# Patient Record
Sex: Male | Born: 1950
Health system: Southern US, Community
[De-identification: ages and names within clinical notes are randomized; demographics above are authoritative.]

## PROBLEM LIST (undated history)

## (undated) DIAGNOSIS — E782 Mixed hyperlipidemia: Secondary | ICD-10-CM

## (undated) DIAGNOSIS — I739 Peripheral vascular disease, unspecified: Secondary | ICD-10-CM

## (undated) DIAGNOSIS — I471 Supraventricular tachycardia: Secondary | ICD-10-CM

## (undated) DIAGNOSIS — G4733 Obstructive sleep apnea (adult) (pediatric): Secondary | ICD-10-CM

## (undated) DIAGNOSIS — Z87442 Personal history of urinary calculi: Secondary | ICD-10-CM

## (undated) DIAGNOSIS — F329 Major depressive disorder, single episode, unspecified: Secondary | ICD-10-CM

## (undated) DIAGNOSIS — F32A Depression, unspecified: Secondary | ICD-10-CM

## (undated) DIAGNOSIS — J449 Chronic obstructive pulmonary disease, unspecified: Secondary | ICD-10-CM

## (undated) DIAGNOSIS — F419 Anxiety disorder, unspecified: Secondary | ICD-10-CM

## (undated) DIAGNOSIS — K219 Gastro-esophageal reflux disease without esophagitis: Secondary | ICD-10-CM

## (undated) DIAGNOSIS — F410 Panic disorder [episodic paroxysmal anxiety] without agoraphobia: Secondary | ICD-10-CM

## (undated) DIAGNOSIS — I4719 Other supraventricular tachycardia: Secondary | ICD-10-CM

## (undated) HISTORY — PX: BACK SURGERY: SHX140

## (undated) HISTORY — PX: OTHER SURGICAL HISTORY: SHX169

## (undated) HISTORY — DX: Anxiety disorder, unspecified: F41.9

## (undated) HISTORY — DX: Obstructive sleep apnea (adult) (pediatric): G47.33

## (undated) HISTORY — DX: Major depressive disorder, single episode, unspecified: F32.9

## (undated) HISTORY — DX: Gastro-esophageal reflux disease without esophagitis: K21.9

## (undated) HISTORY — DX: Chronic obstructive pulmonary disease, unspecified: J44.9

## (undated) HISTORY — DX: Peripheral vascular disease, unspecified: I73.9

## (undated) HISTORY — PX: CORONARY ANGIOPLASTY WITH STENT PLACEMENT: SHX49

## (undated) HISTORY — PX: CARDIAC ELECTROPHYSIOLOGY MAPPING AND ABLATION: SHX1292

## (undated) HISTORY — DX: Depression, unspecified: F32.A

## (undated) HISTORY — DX: Supraventricular tachycardia: I47.1

## (undated) HISTORY — DX: Personal history of urinary calculi: Z87.442

## (undated) HISTORY — DX: Other supraventricular tachycardia: I47.19

## (undated) HISTORY — DX: Mixed hyperlipidemia: E78.2

---

## 2004-12-23 ENCOUNTER — Ambulatory Visit: Payer: Self-pay | Admitting: Cardiology

## 2005-09-17 ENCOUNTER — Ambulatory Visit: Payer: Self-pay | Admitting: Cardiology

## 2005-10-04 ENCOUNTER — Ambulatory Visit: Payer: Self-pay | Admitting: Cardiology

## 2008-07-09 ENCOUNTER — Ambulatory Visit: Payer: Self-pay | Admitting: Cardiology

## 2008-07-19 ENCOUNTER — Ambulatory Visit: Payer: Self-pay | Admitting: Cardiology

## 2008-08-09 ENCOUNTER — Ambulatory Visit: Payer: Self-pay | Admitting: Cardiology

## 2009-01-20 ENCOUNTER — Ambulatory Visit: Payer: Self-pay | Admitting: Cardiology

## 2009-02-14 ENCOUNTER — Encounter: Payer: Self-pay | Admitting: Cardiology

## 2009-02-14 ENCOUNTER — Ambulatory Visit: Payer: Self-pay | Admitting: Internal Medicine

## 2009-02-20 ENCOUNTER — Encounter: Payer: Self-pay | Admitting: Cardiology

## 2009-02-24 ENCOUNTER — Ambulatory Visit: Payer: Self-pay | Admitting: Cardiology

## 2009-02-24 ENCOUNTER — Ambulatory Visit (HOSPITAL_COMMUNITY): Admission: RE | Admit: 2009-02-24 | Discharge: 2009-02-24 | Payer: Self-pay | Admitting: Cardiology

## 2009-02-26 ENCOUNTER — Encounter: Payer: Self-pay | Admitting: Cardiology

## 2009-03-03 ENCOUNTER — Ambulatory Visit: Payer: Self-pay | Admitting: Internal Medicine

## 2009-03-03 ENCOUNTER — Inpatient Hospital Stay (HOSPITAL_COMMUNITY): Admission: RE | Admit: 2009-03-03 | Discharge: 2009-03-04 | Payer: Self-pay | Admitting: Internal Medicine

## 2009-03-07 ENCOUNTER — Encounter: Payer: Self-pay | Admitting: Cardiovascular Disease

## 2009-03-24 ENCOUNTER — Ambulatory Visit: Payer: Self-pay | Admitting: Cardiology

## 2009-03-27 ENCOUNTER — Encounter: Payer: Self-pay | Admitting: Cardiology

## 2009-04-03 ENCOUNTER — Ambulatory Visit: Payer: Self-pay | Admitting: Internal Medicine

## 2009-04-03 DIAGNOSIS — I739 Peripheral vascular disease, unspecified: Secondary | ICD-10-CM

## 2009-04-03 DIAGNOSIS — I959 Hypotension, unspecified: Secondary | ICD-10-CM | POA: Insufficient documentation

## 2009-04-03 DIAGNOSIS — I471 Supraventricular tachycardia: Secondary | ICD-10-CM

## 2009-04-10 ENCOUNTER — Encounter (INDEPENDENT_AMBULATORY_CARE_PROVIDER_SITE_OTHER): Payer: Self-pay | Admitting: *Deleted

## 2009-04-10 ENCOUNTER — Ambulatory Visit: Payer: Self-pay | Admitting: Cardiovascular Disease

## 2009-04-10 DIAGNOSIS — F17200 Nicotine dependence, unspecified, uncomplicated: Secondary | ICD-10-CM | POA: Insufficient documentation

## 2009-04-22 ENCOUNTER — Encounter: Payer: Self-pay | Admitting: Cardiovascular Disease

## 2009-04-24 ENCOUNTER — Encounter: Payer: Self-pay | Admitting: Cardiovascular Disease

## 2009-04-30 ENCOUNTER — Inpatient Hospital Stay (HOSPITAL_COMMUNITY): Admission: RE | Admit: 2009-04-30 | Discharge: 2009-05-01 | Payer: Self-pay | Admitting: Cardiovascular Disease

## 2009-04-30 ENCOUNTER — Ambulatory Visit: Payer: Self-pay | Admitting: Cardiovascular Disease

## 2009-05-27 ENCOUNTER — Ambulatory Visit: Payer: Self-pay | Admitting: Cardiovascular Disease

## 2009-10-06 ENCOUNTER — Encounter: Payer: Self-pay | Admitting: Cardiology

## 2009-10-06 ENCOUNTER — Ambulatory Visit: Payer: Self-pay | Admitting: Cardiology

## 2009-10-08 ENCOUNTER — Encounter: Payer: Self-pay | Admitting: Cardiology

## 2009-10-09 ENCOUNTER — Encounter: Payer: Self-pay | Admitting: Cardiology

## 2009-12-23 ENCOUNTER — Encounter: Payer: Self-pay | Admitting: Cardiology

## 2009-12-23 ENCOUNTER — Telehealth (INDEPENDENT_AMBULATORY_CARE_PROVIDER_SITE_OTHER): Payer: Self-pay | Admitting: *Deleted

## 2009-12-26 ENCOUNTER — Ambulatory Visit: Payer: Self-pay | Admitting: Cardiology

## 2009-12-26 DIAGNOSIS — R609 Edema, unspecified: Secondary | ICD-10-CM

## 2009-12-26 DIAGNOSIS — R0602 Shortness of breath: Secondary | ICD-10-CM | POA: Insufficient documentation

## 2009-12-26 DIAGNOSIS — F411 Generalized anxiety disorder: Secondary | ICD-10-CM | POA: Insufficient documentation

## 2009-12-29 ENCOUNTER — Ambulatory Visit: Payer: Self-pay | Admitting: Cardiology

## 2010-01-02 ENCOUNTER — Encounter: Payer: Self-pay | Admitting: Cardiology

## 2010-01-09 ENCOUNTER — Encounter (INDEPENDENT_AMBULATORY_CARE_PROVIDER_SITE_OTHER): Payer: Self-pay | Admitting: *Deleted

## 2010-01-15 ENCOUNTER — Ambulatory Visit: Payer: Self-pay | Admitting: Cardiology

## 2010-02-02 ENCOUNTER — Encounter (INDEPENDENT_AMBULATORY_CARE_PROVIDER_SITE_OTHER): Payer: Self-pay | Admitting: *Deleted

## 2010-02-02 ENCOUNTER — Encounter: Payer: Self-pay | Admitting: Cardiology

## 2010-02-17 ENCOUNTER — Encounter: Payer: Self-pay | Admitting: Cardiology

## 2010-02-20 ENCOUNTER — Encounter (INDEPENDENT_AMBULATORY_CARE_PROVIDER_SITE_OTHER): Payer: Self-pay | Admitting: *Deleted

## 2010-12-31 NOTE — Letter (Signed)
Summary: Engineer, materials at [facility]  [address]   [place], [place] [address]   Phone: [phone]  Fax: [phone]        February 20, 2010 MRN: [id number]   AVERY KLINGBEIL [address] [place], [place]  [address]   Dear Mr. FUSTON,  Your test ordered by Selena Batten has been reviewed by your physician (or physician assistant) and was found to be normal or stable. Your physician (or physician assistant) felt no changes were needed at this time.  ____ Echocardiogram  ____ Cardiac Stress Test  __X__ Lab Work  ____ Peripheral vascular study of arms, legs or neck  ____ CT scan or X-ray  ____ Lung or Breathing test  ____ Other:   Thank you.   Hoover Brunette, LPN    Duane Boston, M.D., F.A.C.C. Thressa Sheller, M.D., F.A.C.C. Oneal Grout, M.D., F.A.C.C. Cheree Ditto, M.D., F.A.C.C. Daiva Nakayama, M.D., F.A.C.C. Kenney Houseman, M.D., F.A.C.C. Jeanne Ivan, PA-C

## 2010-12-31 NOTE — Miscellaneous (Signed)
Summary: Orders Update - BMET   Clinical Lists Changes  Orders: Added new Test order of T-Basic Metabolic Panel ([id number]) - Signed 

## 2010-12-31 NOTE — Assessment & Plan Note (Signed)
Summary: vitals, f/u on edema -agh  Nurse Visit   Vital Signs:  Patient profile:   60 year old male Weight:      319.50 pounds Pulse rate:   90 / minute BP sitting:   115 / 74  (left arm) Cuff size:   large  Vitals Entered By: Hoover Brunette, LPN (December 29, 2009 9:12 AM)  Patient Instructions: 1)  Labs:  end of week  (BMET) 2)  Follow up:  Thursday, January 15, 2010 at 9:15  Comments feeling some better today.  Legs still with edema, but better than before.   Hoover Brunette, LPN  December 29, 2009 9:13 AM   Per Dr. Andee Lineman, another BMET end of the week.  OV in 10-14 days.     Allergies: 1)  ! * Contrast Dye  Orders Added: 1)  T-Basic Metabolic Panel [phone]

## 2010-12-31 NOTE — Letter (Signed)
Summary: Generic Engineer, agricultural at [facility] [address]   [place], [place] [address]   Phone: [phone]  Fax: [phone]        February 02, 2010 MRN: [id number]    Edward Andrews [address] [place], [place]  [address]    Dear [name],  According to our records, it is now time for your follow up lab work.  Please take the enclosed order to the [facility] at your earliest convenience.          Sincerely,  Hoover Brunette, LPN  This letter has been electronically signed by your physician.

## 2010-12-31 NOTE — Letter (Signed)
Summary: Engineer, materials at [facility]  [address]   [place], [place] [address]   Phone: [phone]  Fax: [phone]        January 09, 2010 MRN: [id number]   DIJUAN SLEETH [address] [place], [place]  [address]   Dear Mr. GAHAN,  Your test ordered by Selena Batten has been reviewed by your physician (or physician assistant) and was found to be normal or stable. Your physician (or physician assistant) felt no changes were needed at this time.  ____ Echocardiogram  ____ Cardiac Stress Test  __X__ Lab Work  ____ Peripheral vascular study of arms, legs or neck  ____ CT scan or X-ray  ____ Lung or Breathing test  ____ Other:   Thank you.   Hoover Brunette, LPN    Duane Boston, M.D., F.A.C.C. Thressa Sheller, M.D., F.A.C.C. Oneal Grout, M.D., F.A.C.C. Cheree Ditto, M.D., F.A.C.C. Daiva Nakayama, M.D., F.A.C.C. Kenney Houseman, M.D., F.A.C.C. Jeanne Ivan, PA-C

## 2010-12-31 NOTE — Assessment & Plan Note (Signed)
Summary: edema --agh   Visit Type:  Follow-up Referring Provider:  Earnestine Leys Primary Provider:  Dr. Linna Darner  CC:  Edema, SOB, and panic attacks.  History of Present Illness: the patient is a 60 year old male with a history of nonobstructive coronary artery disease, status-post SVT ablation, hyperlipidemia, GERD, COPD and sleep apnea with ongoing tobacco use.  The patient has significant peripheral vascular disease and underwent stenting of the right common iliac artery in June of 2010.  Post procedure the patient had itching it was not clear whether was from contrast her Plavix.  Plavix was discontinued.  He had complete resolution of right thigh and buttock pain.  Dr. Excell Seltzer discussed PTA/stenting of the proximal SFA but the patient did not want to proceed at this point. The patient had contacted me several days ago after he had noticed that he had marked swelling in the lower extremities.  This was associated with significant anxiety and even panic attacks.  The patient felt that his legs were very tight but had no definite complaints of claudication.  He did have increased shortness of breath.  He denied any palpitations or syncope.  The patient stayed on his current regimen his blood diureses very much.  Clinical Review Panels:  Vascular Studies Arterial Doppler Performed at [facility]: Right ABI 0.74. Left ABI 0.81 Appears to be high grade stenosis of right iliac system and at least moderate disease below the right popliteal. There is moderate left popliteal and infrapopliteal disease. (03/07/2009)  Cardiac Imaging Cardiac Cath Findings  HEMODYNAMIC DATA:  Central aortic pressure 135/71.      IMPRESSION:   1. Peripheral vascular disease.   2. Severe stenosis of the right common iliac arteries, status post       percutaneous transluminal angioplasty and stent placement.   3. Severe stenosis of the left superficial femoral artery in the       proximal portion.   4. Small abdominal  aortic aneurysm.      RECOMMENDATIONS:  The patient will be continued on aspirin and Plavix on   a daily basis.  We monitored overnight.  We will see him back in the   office in several weeks and then we will arrange a time for him to come   back and have the left superficial femoral artery worked up.  We will   follow him with yearly ultrasounds to follow his infrarenal aortic   aneurysm.               Verne Carrow, MD   Electronically Signed            CM/MEDQ  D:  04/30/2009  T:  04/30/2009  Job:  [id number]      cc:   Learta Codding, MD,FACC  (04/30/2009)    Preventive Screening-Counseling & Management  Alcohol-Tobacco     Smoking Status: current     Packs/Day: 1.0     Year Started: 1981  Current Medications (verified): 1)  Metoprolol Succinate 50 Mg Xr24h-Tab (Metoprolol Succinate) .... Take One Tablet By Mouth Daily 2)  Omeprazole 20 Mg Cpdr (Omeprazole) .... Take 1 Tablet By Mouth Once A Day 3)  Potassium Chloride Crys Cr 20 Meq Cr-Tabs (Potassium Chloride Crys Cr) .... Take 2 Tabs ( ) Daily 4)  Simvastatin 40 Mg Tabs (Simvastatin) .... Take One Time At Bedtime 5)  Metoclopramide Hcl 10 Mg Tabs (Metoclopramide Hcl) .... Take One Tab At Bedtime 6)  Oxazepam 10 Mg Tabs (Oxazepam) .... Take 1 1/2 Tabs (  15mg ) Every Morning and At Noon 7)  Advair Diskus 250-50 Mcg/dose Misc (Fluticasone-Salmeterol) .... One Puff  Two Times A Day 8)  Duoneb 0.5-2.5 (3) Mg/27ml Soln (Ipratropium-Albuterol) .... Three Times A Day 9)  Ventolin Hfa 108 (90 Base) Mcg/act Aers (Albuterol Sulfate) .... Two Puffs Four Times A Day 10)  Aspirin Ec 325 Mg Tbec (Aspirin) .... Take One Tablet By Mouth Daily 11)  Furosemide 80 Mg Tabs (Furosemide) .... Take 1 Tablet By Mouth Two Times A Day 12)  Clonazepam 0.5 Mg Tabs (Clonazepam) .... Take 1 Tablet By Mouth Two Times A Day 13)  Citalopram Hydrobromide 20 Mg Tabs (Citalopram Hydrobromide) .... Take 1 Tablet By Mouth Every Morning  Allergies: 1)   ! * Contrast Dye  Comments:  Nurse/Medical Assistant: The patient's medications were reviewed with the patient and were updated in the Medication List. Pt brought medication bottles to office visit.  Cyril Loosen, RN, BSN (December 26, 2009 11:03 AM)  Past History:  Past Medical History: Last updated: 04/10/2009 supraventricular tachycardia 08/09 Dyslipidemia Cardiolite EF was apparently 29% Nonobstructive coronary artery disease at left heart   Possible Claudication  Chronic obstructive pulmonary disease  Gastroesophageal reflux disease   History of back surgeries and cervical surgeries  History of nephrolithiasis  Ongoing tobacco habituation Anxiety and depression obstructive sleep apnea    PVD  Family History: Reviewed history from 04/10/2009 and no changes required.  Mother-Cancer, Lung Father-Stomach cancer Brother-brain tumor Younger brother stroke No CAD  Social History: Reviewed history from 04/10/2009 and no changes required. Tobacco Use - Yes. 1ppd for 38 years Occasional alcohol No illicit drugs Married, 2 children Disabled. Former Naval architect Packs/Day:  1.0  Review of Systems       The patient complains of shortness of breath, sleep apnea, and leg swelling.  The patient denies fatigue, malaise, fever, weight gain/loss, vision loss, decreased hearing, hoarseness, chest pain, palpitations, prolonged cough, wheezing, coughing up blood, abdominal pain, blood in stool, nausea, vomiting, diarrhea, heartburn, incontinence, blood in urine, muscle weakness, joint pain, rash, skin lesions, headache, fainting, dizziness, depression, anxiety, enlarged lymph nodes, easy bruising or bleeding, and environmental allergies.    Vital Signs:  Patient profile:   60 year old male Height:      72 inches Weight:      327.25 pounds BMI:     44.54 Pulse rate:   85 / minute BP sitting:   132 / 84  (left arm) Cuff size:   large  Vitals Entered By: Cyril Loosen, RN, BSN  (December 26, 2009 10:55 AM)  Nutrition Counseling: Patient's BMI is greater than 25 and therefore counseled on weight management options. CC: Edema, SOB, panic attacks Comments Pt c/o edema, SOB, and panic attacks. States SOB has improved now that he has "attacks" under control.   Physical Exam  Additional Exam:  General: Well-developed, well-nourished in no distress head: Normocephalic and atraumatic eyes PERRLA/EOMI intact, conjunctiva and lids normal nose: No deformity or lesions mouth normal dentition, normal posterior pharynx neck: Supple, no JVD.  No masses, thyromegaly or abnormal cervical nodes lungs: Normal breath sounds bilaterally without wheezing.  Normal percussion heart: regular rate and rhythm with normal S1 and S2, no S3 or S4.  PMI is normal.  No pathological murmurs abdomen: Normal bowel sounds, abdomen is soft and nontender without masses, organomegaly or hernias noted.  No hepatosplenomegaly musculoskeletal: Back normal, normal gait muscle strength and tone normal pulsus: Pulse is normal in all 4 extremities Extremities:severe 3+ pitting edema  of both lower extremities extending up into the buttock area neurologic: Alert and oriented x 3 skin: Intact without lesions or rashes cervical nodes: No significant adenopathy psychologic: Normal affect    EKG  Procedure date:  12/26/2009  Findings:      normal sinus rhythm normal EKG heart rate 78 beats/min  Impression & Recommendations:  Problem # 1:  EDEMA (ICD-782.3) the patient has severe lower extremity edema.  The etiology is not entirely clear but he does have right-sided heart failure and we will obtain an echocardiogram to assess in particular right ventricular function.  I also ordered lower extremity Dopplers to rule out DVT.  The patient will be referred to the short stay unit for intravenous Lasix and oral potassium supplements the patient will done also be started on Lasix 80 mg twice a day 40 mg  equivalents of potassium daily we will recheck his blood work in particular potassium level and renal function on Monday.  The wall 7 nurse visit on Monday. Orders: Misc. Referral (Misc. Ref) T-Chest x-ray, 2 views ([id number]) 2-D Echocardiogram (2D Echo) Venous Duplex Lower Extremity (Venous Dup Lower E) T-Basic Metabolic Panel ([id number]) T-CBC No Diff ([id number]) T-BNP  (B Natriuretic Peptide) ([id number]) T-TSH ([id number] Orders: T-Basic Metabolic Panel [id number]) ... 12/29/2009  Problem # 2:  PAROXYSMAL SUPRAVENTRICULAR TACHYCARDIA (ICD-427.0) Assessment: Improved  His updated medication list for this problem includes:    Metoprolol Succinate 50 Mg Xr24h-tab (Metoprolol succinate) .Marland Kitchen... Take one tablet by mouth daily    Aspirin Ec 325 Mg Tbec (Aspirin) .Marland Kitchen... Take one tablet by mouth daily  Orders: T-Basic Metabolic Panel [id number]) T-CBC No Diff ([id number]) T-BNP  (B Natriuretic Peptide) ([id number]) T-TSH ([id number] Orders: T-Basic Metabolic Panel [id number]) ... 12/29/2009  Problem # 3:  PERIPHERAL VASCULAR DISEASE (ICD-443.9) the patient does have stable peripheral vascular disease.  He currently does not report claudication.  Problem # 4:  ANXIETY STATE, UNSPECIFIED (ICD-300.00) the patient has significant anxiety even today in the office.  He is very concerned and worried about his medical problems.  I started the patient citalopram 20 mg p.o. daily as well as an escalation in the dose of oxazepam adding 15 mg in the morning and 15 mg at noon.  Patient Instructions: 1)  Chest x-ray , 2D Echo, lower extremity doppler, labs, & IV Lasix - all done today.   2)  Lasix 80mg  two times a day  3)  K-Dur daily  4)  Labs again on Monday, 1/31 at hospital (BMET) 5)  Nurse visit on 1/31, Monday at 9:15 6)  Citalopram 20mg  daily  7)  Oxazepam 15mg  tabs every morning and noon 8)  Next follow up visit may be based on nurse visit for  Monday Prescriptions: POTASSIUM CHLORIDE CRYS CR 20 MEQ CR-TABS (POTASSIUM CHLORIDE CRYS CR) take 2 tabs ( ) daily  #60 x 6   Entered by:   Hoover Brunette, LPN   Authorized by:   Lewayne Bunting, MD, [facility]   Signed by:   Hoover Brunette, LPN on 76/16/0737   Method used:   [facility] to        [facility]  [address] [address] [facility]       [address]       [place], [place]  [address]       Ph: [phone] or [phone]       Fax: [phone]   RxID:   [id number] FUROSEMIDE 80 MG TABS (FUROSEMIDE)  Take 1 tablet by mouth two times a day  #60 x 6   Entered by:   Hoover Brunette, LPN   Authorized by:   Lewayne Bunting, MD, [facility]   Signed by:   Hoover Brunette, LPN on 16/08/9603   Method used:   [facility]        [facility]  [address] [facility]       [address]       [place], [place]  [address]       Ph: [phone] or [phone]       Fax: [phone]   RxID:   [id number] OXAZEPAM 10 MG TABS (OXAZEPAM) take 1 1/2 tabs (15mg ) every morning and at noon  #90 x 1   Entered by:   Hoover Brunette, LPN   Authorized by:   Lewayne Bunting, MD, [facility]   Signed by:   Hoover Brunette, LPN on 40/34/7425   Method used:   Handwritten   RxID:   [id number] CITALOPRAM HYDROBROMIDE 20 MG TABS (CITALOPRAM HYDROBROMIDE) Take 1 tablet by mouth every morning  #30 x 1   Entered by:   Hoover Brunette, LPN   Authorized by:   Lewayne Bunting, MD, [facility]   Signed by:   Hoover Brunette, LPN on 84/16/6063   Method used:   [facility]        [facility]  [address] [facility]       [address]       [place], [place]  [address]       Ph: [phone] or [phone]       Fax: [phone]   RxID:   [id number]

## 2010-12-31 NOTE — Assessment & Plan Note (Signed)
Summary: f/u on edema  -agh   Visit Type:  Follow-up Referring Provider:  Earnestine Leys Primary Provider:  Dr. Linna Darner  CC:  follow-up visit.  History of Present Illness: the patient is a 60 year old male with a history of nonobstructive coronary artery disease, status post SVT ablation, hyperlipidemia, alert, COPD and sleep apnea with ongoing tobacco use.  The patient also significant peripheral vascular disease and is status post stenting of the right common iliac artery in June of 2010.  There was also some salt off stenting of the proximal SFA but ultimately this procedure was not performed.  The patient was seen recently because of increased lower extremity edema as a matter fact yet severe/massive lower extremity edema.  The patient already had a CT scan of the abdomen and pelvis done but there were no obstructive masses explaining his lower extremity edema.  It was felt that the patient diastolic heart failure and we placed him on high dose Lasix.  We also followed closely his SMA-7.  His lower extremity edema has all but resolved now and his creatinine has stayed stable around 1.2.   Preventive Screening-Counseling & Management  Alcohol-Tobacco     Smoking Status: current     Smoking Cessation Counseling: yes     Packs/Day: 1 PPD  Current Medications (verified): 1)  Metoprolol Succinate 50 Mg Xr24h-Tab (Metoprolol Succinate) .... Take One Tablet By Mouth Daily 2)  Omeprazole 20 Mg Cpdr (Omeprazole) .... Take 1 Tablet By Mouth Once A Day 3)  Potassium Chloride Crys Cr 20 Meq Cr-Tabs (Potassium Chloride Crys Cr) .... Take 2 Tabs ( ) Daily 4)  Simvastatin 40 Mg Tabs (Simvastatin) .... Take One Time At Bedtime 5)  Metoclopramide Hcl 10 Mg Tabs (Metoclopramide Hcl) .... Take One Tab At Bedtime 6)  Oxazepam 15 Mg Caps (Oxazepam) .... Take One By Mouth in Am,one At Lunch,& Two At  Bedtime 7)  Advair Diskus 250-50 Mcg/dose Misc (Fluticasone-Salmeterol) .... One Puff  Two Times A Day 8)   Duoneb 0.5-2.5 (3) Mg/60ml Soln (Ipratropium-Albuterol) .... Three Times A Day 9)  Ventolin Hfa 108 (90 Base) Mcg/act Aers (Albuterol Sulfate) .... Two Puffs Four Times A Day 10)  Aspirin Ec 325 Mg Tbec (Aspirin) .... Take One Tablet By Mouth Daily 11)  Furosemide 80 Mg Tabs (Furosemide) .... Take 1 Tablet By Mouth Two Times A Day 12)  Clonazepam 0.5 Mg Tabs (Clonazepam) .... Take 1 Tablet By Mouth Two Times A Day 13)  Citalopram Hydrobromide 20 Mg Tabs (Citalopram Hydrobromide) .... Take 1 Tablet By Mouth Every Morning  Allergies (verified): 1)  ! * Contrast Dye  Comments:  Nurse/Medical Assistant: The patient is currently on medications but does not know the name or dosage at this time. Instructed to contact our office with details. Will update medication list at that time.  Past History:  Past Medical History: Last updated: 04/10/2009 supraventricular tachycardia 08/09 Dyslipidemia Cardiolite EF was apparently 29% Nonobstructive coronary artery disease at left heart   Possible Claudication  Chronic obstructive pulmonary disease  Gastroesophageal reflux disease   History of back surgeries and cervical surgeries  History of nephrolithiasis  Ongoing tobacco habituation Anxiety and depression obstructive sleep apnea    PVD  Past Surgical History: Last updated: 05/27/2009 1. 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  2. Kidney stone removal 3.Back surgeries 4. Neck surgery 5. PTA/stent of right common iliac  artery  Family History: Last updated: 04/10/2009  Mother-Cancer, Lung Father-Stomach cancer Brother-brain tumor Younger brother stroke No CAD  Social History: Last updated: 04/10/2009 Tobacco Use - Yes. 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  The patient denies fatigue, malaise, fever, weight gain/loss, vision loss, decreased hearing, hoarseness, chest pain, palpitations, shortness of breath, prolonged cough, wheezing, sleep apnea, coughing up blood, abdominal pain, blood in stool, nausea, vomiting, diarrhea, heartburn, incontinence, blood in urine, muscle weakness, joint pain, rash, skin lesions, headache, fainting, dizziness, depression, anxiety, enlarged lymph nodes, easy bruising or bleeding, and environmental allergies.    Vital Signs:  Patient profile:   60 year old male Height:      72 inches Weight:      305 pounds Pulse rate:   67 / minute BP sitting:   116 / 78  (left arm) Cuff size:   large  Vitals Entered By: Carlye Grippe (January 15, 2010 9:29 AM) CC: follow-up visit   Physical Exam  Additional Exam:  General: Well-developed, well-nourished in no distress head: Normocephalic and atraumatic eyes PERRLA/EOMI intact, conjunctiva and lids normal nose: No deformity or lesions mouth normal dentition, normal posterior pharynx neck: Supple, no JVD.  No masses, thyromegaly or abnormal cervical nodes lungs: Normal breath sounds bilaterally without wheezing.  Normal percussion heart: regular rate and rhythm with normal S1 and S2, no S3 or S4.  PMI is normal.  No pathological murmurs abdomen: Normal bowel sounds, abdomen is soft and nontender without masses, organomegaly or hernias noted.  No hepatosplenomegaly musculoskeletal: Back normal, normal gait muscle strength and tone normal pulsus: Pulse is normal in all 4 extremities Extremities: 1+ peripheral pitting edema neurologic: Alert and oriented x 3 skin: Intact without lesions or rashes cervical nodes: No significant adenopathy psychologic: Normal affect    Impression &  Recommendations:  Problem # 1:  EDEMA (ICD-782.3) the patient's edema has markedly improved.  We will continue on Lasix.  We will check an SMA-7 and a magnesium level in one month.  Problem # 2:  SHORTNESS OF BREATH (ICD-786.05) the patient's shortness of breath has markedly improved.  He clearly has diastolic heart failure. His updated medication list for this problem includes:    Metoprolol Succinate 50 Mg Xr24h-tab (Metoprolol succinate) .Marland Kitchen... Take one tablet by mouth daily    Aspirin Ec 325 Mg Tbec (Aspirin) .Marland Kitchen... Take one tablet by mouth daily    Furosemide 80 Mg Tabs (Furosemide) .Marland Kitchen... Take 1 tablet by mouth two times a day  Problem # 3:  PERIPHERAL VASCULAR DISEASE (ICD-443.9) Assessment: Improved  Problem # 4:  ANXIETY STATE, UNSPECIFIED (ICD-300.00) the patient's anxiety also is markedly improved on his current medical regimen.  Patient Instructions: 1)  BMET in one month. 2)  Follow up in  6 months.

## 2010-12-31 NOTE — Progress Notes (Signed)
Summary: edema  Phone Note Call from Patient   Summary of Call: c/o edema from knees down to feet along with SOB.  Per Dr. Andee Lineman, give Lasix 20mg  daily till seen in office on Friday.  Appt. scheduled for Friday, January 28.  Has been previously on Torsemide, but did not feel that it helped so has not been taking lately.  Pt. also requested something for nerves.  Advised pt. that we could not give at this time, has not been seen in office in about 6 months or more.  Can address at office visit or contact PMD.   Initial call taken by: Hoover Brunette, LPN,  December 23, 2009 10:55 AM

## 2010-12-31 NOTE — Miscellaneous (Signed)
Summary: rx - lasix  Clinical Lists Changes  Medications: Added new medication of FUROSEMIDE 20 MG TABS (FUROSEMIDE) Take 1 tablet by mouth once a day - Signed Rx of FUROSEMIDE 20 MG TABS (FUROSEMIDE) Take 1 tablet by mouth once a day;  #30 x 1;  Signed;  Entered by: Hoover Brunette, LPN;  Authorized by: Lewayne Bunting, MD, [facility];  Method used: [facility]  [address] [facility] [address], [place], [place], [place]  [address], Ph: [phone] or [phone], Fax: [phone]    Prescriptions: FUROSEMIDE 20 MG TABS (FUROSEMIDE) Take 1 tablet by mouth once a day  #30 x 1   Entered by:   Hoover Brunette, LPN   Authorized by:   Lewayne Bunting, MD, [facility]   Signed by:   Hoover Brunette, LPN on 25/36/6440   Method used:   [facility]        [facility]  [address] [facility]       [address]       [place], [place]  [address]       Ph: [phone] or [phone]       Fax: [phone]   RxID:   [id number]

## 2011-01-01 NOTE — Letter (Signed)
Summary: [facility] D/C DR. MOHAMMAD ANWAR  [facility] D[facility]C DR. MOHAMMAD ANWAR   Imported By: Zachary George 12/25/2009 16:44:57  _____________________________________________________________________  External Attachment:    Type:   Image     Comment:   External Document

## 2011-02-17 ENCOUNTER — Other Ambulatory Visit: Payer: Self-pay | Admitting: Cardiology

## 2011-02-25 NOTE — Telephone Encounter (Signed)
[place] 

## 2011-03-08 LAB — BASIC METABOLIC PANEL
BUN: 19 mg/dL (ref 6–23)
Calcium: 9.1 mg/dL (ref 8.4–10.5)
Creatinine, Ser: 0.86 mg/dL (ref 0.4–1.5)
GFR calc non Af Amer: >60 mL/min (ref >60)
Glucose, Bld: 109 mg/dL — ABNORMAL HIGH (ref 70–99)

## 2011-03-08 LAB — CBC
HCT: 42 % (ref 39.0–52.0)
Platelets: 194 10*3/uL (ref 150–400)
RDW: 14.6 % (ref 11.5–15.5)

## 2011-03-10 LAB — CBC
Hemoglobin: 15.4 g/dL (ref 13.0–17.0)
RBC: 4.71 MIL/uL (ref 4.22–5.81)
RDW: 14.3 % (ref 11.5–15.5)
WBC: 10.1 10*3/uL (ref 4.0–10.5)

## 2011-03-10 LAB — PROTIME-INR: INR: 1 (ref 0.00–1.49)

## 2011-03-10 LAB — APTT: aPTT: 28 seconds (ref 24–37)

## 2011-03-11 LAB — POCT I-STAT 3, ART BLOOD GAS (G3+)
pH, Arterial: 7.379 (ref 7.350–7.450)
pO2, Arterial: 61 mmHg — ABNORMAL LOW (ref 80.0–100.0)

## 2011-03-11 LAB — POCT I-STAT 3, VENOUS BLOOD GAS (G3P V)
Acid-base deficit: 1 mmol/L (ref 0.0–2.0)
Bicarbonate: 25 mEq/L — ABNORMAL HIGH (ref 20.0–24.0)
pH, Ven: 7.329 — ABNORMAL HIGH (ref 7.250–7.300)
pO2, Ven: 34 mmHg (ref 30.0–45.0)

## 2011-04-13 NOTE — Letter (Signed)
February 14, 2009    Learta Codding, MD,FACC  [address]  [place], [place] [address]   RE:  Edward Andrews, Edward Andrews  MRN:  [id number]  /  DOB:  November 10, 1951   Dear Michelle Piper,   It was a pleasure seeing Dewitt Judice at your request for considerations  regarding his supraventricular tachycardia.   As you remember, you were asked to see him because of pauses on his  event recorder and as I would have concurred, felt that was attributable  to the medications that were started for his SVT.   His history of SVT goes back about 30 years.  He describes abrupt onset  and offset tachy palpitations somewhat diuretic positive, triggered  frequently by bending as well as by sleeping, but also at least  initially while exerting.  Some years ago, when it started he would get  lightheaded and presyncopal with it, more recently that has not been the  case and has very few symptoms with it.  These can last 30 minutes to 5  or 6 hours.   They are clearly aggravated by stress and caffeine.  He can terminate  them sometimes with Valsalva.   They are associated with indigestion and chest discomfort that persist  further duration.   He does not have known heart disease.  However, he has discordant data.  A Cardiolite year and half ago demonstrated an ejection fraction of 39%  with inferolateral ischemia that is a reversible defect.  An echo was  done that was normal.  Apparently, catheterization was recommended and  he declined.   He is limited on exertion by his legs giving way.  This is somewhat  between a cramping and weakness and a pain.  This precedes short  limitations from breathing.   His other cardiac risk factors are notable for cigarettes, hypertension,  hypercholesterolemia, and family history.   He has also developed peripheral edema of late which has been quite  painful particularly in his right foot in the last couple of days.  He  dates his edema back to the onset of diltiazem and has been treated  with  furosemide for it.   PAST MEDICAL HISTORY:  Notable for COPD and GE reflux disease.   PAST SURGICAL HISTORY:  Notable for back surgeries, nephrolithiasis,  kidney surgery, neck surgeries, and tonsillectomy.   SOCIAL HISTORY:  He is married.  He has 2 children and 5 grandchildren.  He does not use recreational drugs.  He does use cigarettes and alcohol.   CURRENT MEDICATIONS:  1. Metoprolol 50.  2. Potassium.  3. Clonazepam.  4. Lasix 40.  5. Lisinopril 20.  6. Metoclopramide 10 at bedtime.  7. Diltiazem 180.  8. Oxazepam 30.  9. Citalopram 20.  10.Omeprazole 40.  11.Advair.  12.Simvastatin.   He has no known drug allergies.   PHYSICAL EXAMINATION:  VITAL SIGNS:  His blood pressure is 121/81.  His  pulse was 85.  His weight was 298 pounds, which appears to be up 40  pounds in the last three and half years.  GENERAL:  He smelled like cigarettes.  He was in no acute distress.  HEENT:  No icterus or xanthoma.  NECK:  Veins were impossible to discern.  His carotids are brisk and  full bilaterally without bruits.  BACK:  Without kyphosis or scoliosis.  LUNGS:  Clear.  HEART:  Sounds were regular, but distant.  ABDOMEN:  Protuberant, but soft.  EXTREMITIES:  Femoral pulses  were not easily appreciated.  Distal  pulses, I think were trace bilaterally.  There is 1-2+ peripheral edema  bilaterally up to the knees and there was some erythema and warmth on  the right foot.  NEUROLOGIC:  Grossly normal.   Electrocardiograms for tachycardia were of numerous on December 13, 2008,  he demonstrated narrow QRS tachycardia with a cycle length of 380 msec.  There was an R prime in lead V1, but he has this at baseline, was  interesting is that there is a prominent Q-wave in lead III, which is  not evident on his sinus rhythm electrocardiogram dated the same day.  The cycle length of this point was 600 msec with intervals of  0.16/0.10/0.35.  There was an R prime is noted and there is  a RS pattern  in lead III.  Also, the event recorder is as you noted associated with a  pause of 3.1 seconds.   IMPRESSION:  1. Supraventricular tachycardia, possibly atrioventricular nodal      reentry, but probably atrioventricular reentry based on the date of      onset.  2. Bradycardia, likely related to the medications for supraventricular      tachycardia.  3. Abnormal cardiovascular studies including:      a.     Myoview demonstrating inferolateral ischemia.  Ejection       fraction of 39% and an echo that showed normal left ventricular       function.  4. Exercise limitations on his legs question claudication.  5. Cardiac risk factors notable for:      a.     The above.      b.     Hypertension.      c.     Cigarettes.      d.     Dyslipidemia.      e.     Family history.  6. Morbid obesity.  7. Likely obstructive sleep apnea.  8. Possible gout in his right foot.  9. Poor  perfusion and decreased femoral pulses   DISCUSSION:  Michelle Piper, Mr. Shimabukuro clearly has supraventricular tachycardia and  he would like to get it ablated.  He really appreciated your insights as  to therapeutic options he would like to proceed.  We have discussed  potential benefits as well as potential risks including, but not limited  to death, perforation, heart block requiring pacemaker implantation,  vascular injury, and bleeding.  He understands these risks.  Given his  size and his use of benzodiazepines and the history consistent with  obstructive sleep apnea, I think doing it with general anesthesia is  most appropriate.   However, prior to this clarifying his coronary situation, I think it is  important.  I have discussed this with him and he is agreeable to  proceeding with catheterization.  We will so set him up for a  catheterization in the outpatient laboratory with Dr. Charlies Constable who  did his wife's catheterization prior to her bypass surgery.   I have suggested that he go to the emergency  room for further therapies  related to his foot, if the symptoms worsen.  I have also suggested that  a sleep study would be appropriate  to address possible sleep apnea.  Clearly his morbid obesity with a 35-pound weight gain is a potential  big issue.  This will require more long-term management.   Thank you very much for the consultation.    Sincerely,  Duke Salvia, MD, [facility]  Electronically Signed    SCK/MedQ  DD: 02/14/2009  DT: 02/15/2009  Job #: [id number]

## 2011-04-13 NOTE — Op Note (Signed)
NAME:  Edward Andrews, Edward Andrews NO.:  [id number]   MEDICAL RECORD NO.:  [id number]          PATIENT TYPE:  INP   LOCATION:  2005                         FACILITY:  [facility]   PHYSICIAN:  Duke Salvia, MD, FACCDATE OF BIRTH:  01-26-1951   DATE OF PROCEDURE:  03/03/2009  DATE OF DISCHARGE:                               OPERATIVE REPORT   PREOPERATIVE DIAGNOSIS:  Supraventricular tachycardia.   POSTOPERATIVE DIAGNOSIS:  Atrioventricular nodal reentry with multiple  slow pathway.   PROCEDURES:  Invasive electrophysiological study, arrhythmia mapping,  and catheter ablation.   PROCEDURE IN DETAIL:  Following obtaining an informed consent, the  patient was brought to the Electrophysiology Laboratory and placed on  the fluoroscopic table in supine position.  After routine prep and  drape, the patient was submitted to general anesthesia.  Catheterization  was performed.  A 5-French quadripolar catheter was inserted via the  left femoral vein to the AV junction.  A 5-French quadripolar catheter was inserted via the femoral vein to the  right ventricular apex.  A 6-French octapolar catheter was inserted via the right femoral vein to  the coronary sinus.  A 7-French 4-mm deflectable tip ablation catheter was inserted via the  right femoral vein to mapping sites in the posterior septal space.   Surface leads I, aVF, and V1 were monitored continuously throughout the  procedure.  Following insertion of the catheters, the stimulation  protocol included incremental atrial pacing.  Incremental ventricular pacing.  Single atrial extrastimuli paced cycle length of 600 milliseconds.   END-TIDAL RESULTS:  End-tidal surface electrocardiogram:  Initial:  Rhythm:  Sinus; RR interval:  1148 milliseconds; PR interval:  151  milliseconds; QRS duration:  97 milliseconds; QT interval:  484  milliseconds; P-wave duration:  110 milliseconds; AH interval:  104  milliseconds; HV interval:  45  milliseconds.  Final:  Rhythm:  Sinus; RR interval 852 milliseconds; PR interval 155  milliseconds; QRS duration:  105 milliseconds; QT interval:  436  milliseconds; P-wave duration: 123 milliseconds; AH interval:  77  milliseconds; HV interval:  54 milliseconds.  AV Wenckebach was less than 400 milliseconds.  VA Wenckebach was 300 milliseconds.  AV nodal effective refractory with a paced cycle length of 600  milliseconds.  Post ablation was 410 milliseconds in the fast pathway  without evidence of antegrade slow pathway function.  AV nodal slow  pathway ERP was less than 390 milliseconds.   AV nodal conduction pre-ablation was discontinuous with echo beats and  inducible tachycardia.  Post-ablation was continuous.  Accessory pathway function:  No evidence of accessory pathway was  identified.  Arrhythmias induced:  AV nodal reentry was reproducibly induced at 600:  410-400 milliseconds.  It was terminated with ventricular pacing at 350  milliseconds.   A typical episode of tachycardia, the VA time was about 55 milliseconds.  AH prolongation, dependence was noted with initiation.  AH variability  was significant so that attempts to pre-excite the atrium with  ventricular stimulation were not possible.  Fluoroscopy time was total of 6 minutes and 29 seconds.  Total  fluoroscopy  time was utilized at 7.5 frames per second.  Radiofrequency energy:  A total of 1 minute and 24 seconds of RF energy  was applied at multiple sites where presumed slow pathway potentials  were identified.  Junctional rhythm ensued after the first applications  with elimination of inducible tachycardia but residual slow pathway  function isolated echo.  One for the lesion eliminated antegrade slow  pathway function.   IMPRESSION:  1. Normal sinus function.  2. Prolonged intra-atrial conduction x3.  Dual antegrade      atrioventricular nodal physiology with inducible slow-fast      atrioventricular nodal  reentrant tachycardia with multiple slow      pathway as demonstrated by variable RR interval ranging from 410-      490 milliseconds.  3. Normal His-Purkinje system function.  4. No accessory pathway.  5. Normal ventricular response to programmed stimulation.   SUMMARY:  In conclusion, the results of electrophysiological testing  demonstrated slow pathway dependent tachycardia with variable slow  pathways involved in antegrade conduction.  Antegrade conduction over  the slow pathway was eliminated by radiofrequency energy applied in the  posterior septal space.  The patient's catheters were then removed and  the patient was transferred to the recovery area in stable condition.      Duke Salvia, MD, [facility]  Electronically Signed     SCK/MEDQ  D:  03/03/2009  T:  03/03/2009  Job:  [id number]   cc:   Learta Codding, MD,FACC  Erasmo Downer, MD

## 2011-04-13 NOTE — Assessment & Plan Note (Signed)
[facility]                          [facility] NOTE   Edward Andrews, Edward Andrews                         MRN:          [id number]  DATE:08/09/2008                            DOB:          Sep 30, 1951    Mr. Mullenbach is seen for followup.  See my complete note of July 19, 2008.  At that time, we were concerned about the possibility of ischemia and we  talked about catheterization.  He is very hesitant and wants to be  followed medically which of course is quite reasonable.  His medications  at this time are nicely controlled.  He is doing well.  He has been more  active.  He is not having any chest pain, shortness of breath, or  nausea.   He admits to me that he has started smoking again.  He had smoked 3  packs per day and then had to quit, but is now back to approximately 10  cigarettes per day.  I told him that I want to encourage him to do the  very best possible to stop smoking.   PAST MEDICAL HISTORY:   ALLERGIES:  No known drug allergies.   MEDICATIONS:  Metoprolol, simvastatin, lisinopril, Advair, Spiriva,  omeprazole, chlordiazepoxide, citalopram, and aspirin 81 mg.   OTHER MEDICAL PROBLEMS:  See the complete list on my note of July 19, 2008.   REVIEW OF SYSTEMS:  He is feeling well.  He is having no particular  problems and his review of systems is negative.   PHYSICAL EXAMINATION:  VITAL SIGNS:  Blood pressure is 129/81 with a  pulse of 69.  Weight is 286 pounds, which is down a few pounds since the  last visit.  HEENT:  Reveals no xanthelasma.  He has normal extraocular  motion.  NECK:  There are no carotid bruits.  There is no jugular venous  distention.  LUNGS:  Clear.  Respiratory effort is not labored.  CARDIAC:  Reveals an S1 with an S2.  There are no clicks or significant  murmurs.  ABDOMEN:  Soft.  He has no peripheral edema.   Problems are listed on the note of July 19, 2008.  He is not having  any significant  palpitations.  We are treating him for possible ischemia  and following him carefully.  I will see him back in 3 months.     Luis Abed, MD, [facility]  Electronically Signed   JDK/MedQ  DD: 08/09/2008  DT: 08/09/2008  Job #: [id number]   cc:   Erasmo Downer, MD

## 2011-04-13 NOTE — Discharge Summary (Signed)
NAME:  Edward Andrews, Edward Andrews NO.:  [id number]   MEDICAL RECORD NO.:  [id number]          PATIENT TYPE:  INP   LOCATION:  2005                         FACILITY:  [facility]   PHYSICIAN:  Duke Salvia, MD, FACCDATE OF BIRTH:  [birth year]   DATE OF ADMISSION:  03/03/2009  DATE OF DISCHARGE:  03/04/2009                               DISCHARGE SUMMARY   TIME FOR THIS DICTATION:  Greater than 45 minutes including explanation  to the patient.   ALLERGIES:  This patient has no known drug allergies.   FINAL DIAGNOSES:  1. Supraventricular tachycardia.  1A.  Tachy palpitations with abrupt onset and offset duration in the  last 30 years.  1. Discharging day 1, status post electrophysiology study with      radiofrequency catheter ablation of a dual-arteriovenous nodal      physiology/atrioventricular nodal reentrant tachycardia.  2A.  Radiofrequency catheter ablation with modification of the slow  pathway.   SECONDARY DIAGNOSES:  1. Nonobstructive coronary artery disease at left heart      catheterization on February 24, 2009, normal left ventricular function      at catheterization.  2. ?Claudication.  Further workup and vascular studies are pending.  3. Chronic obstructive pulmonary disease.  4. Gastroesophageal reflux disease.  5. Morbid obesity.  6. History of back surgeries and cervical surgeries.  7. History of nephrolithiasis.  8. Ongoing tobacco habituation.  9. Strong family history of coronary artery disease.  10.Dyslipidemia.  11.Anxiety and depression.   PROCEDURE:  On March 03, 2009, electrophysiology study and radiofrequency  catheter ablation of atrioventricular nodal reentry tachycardia,  modification of the slow accessory pathway.   BRIEF HISTORY:  Mr. Edward Andrews is a 60 year old male.  He was referred to Dr.  Graciela Husbands by Dr. Andee Lineman.  He has a history of SVT, which goes back 30 years.  He describes his symptoms as abrupt onset and offset of tachy  palpitations.  They  are somewhat diuretic positive.  They can be  triggered by bending as well as sleeping.  They are also aggravated by  stress and caffeine.  He can terminate these arrhythmias sometimes with  Valsalva maneuvers.   The patient does not have any known heart disease in the past after a  slightly abnormal Myoview study, he declined this.   He is limited in his exercise capability by feeling that his legs are  giving way.  He describes this as a gradual onset of fatigue and  tiredness.  His legs then get crampy wobbly and give-way, this limits  his exercise tolerance and precedes limitations from dyspnea.   The patient has worn a monitor, which has shown SVT.  In discussion with  Dr. Graciela Husbands, the patient related that he would favor ablation of this  dysrhythmia.  The risks and benefits as well as the expectations going  forward have been described to the patient.  The patient has a heavy use  of benzodiazepines.  He has a history, which is consistent with  obstructive sleep apnea.  This will be done under general anesthesia on  an  elective basis.   HOSPITAL COURSE:  The patient presents electively on March 03, 2009, he  underwent electrophysiology study with successful radiofrequency  catheter ablation of an AV nodal reentry tachycardia by means of a slow  pathway modification, there was no repeat to his dysrhythmia in the EP  lab after ablation.  The patient is ready for discharge postprocedure  day #1.  He was seen in consultation by Dr. Tonny Bollman for possible  peripheral vascular occlusive disease, Dr. Excell Seltzer recommended vascular  studies including segmental pressures and ankle brachial indexes to be  done in [name].  The patient is discharging on March 04, 2009.   MEDICATIONS:  1. A new dose of metoprolol 25 mg twice daily down from 50 mg twice      daily.  2. Omeprazole 40 mg daily.  3. Torsemide 20 mg daily.  4. K-Dur 20 mEq daily.  5. Lisinopril 20 mg daily.  6. Simvastatin 40  mg daily at bedtime.  7. Metoclopramide 10 mg daily at bedtime.  8. Oxazepam 30 mg daily at bedtime.  9. Clonazepam 0.5 mg twice daily.  10.Citalopram 20 mg daily at bedtime.  11.Advair 250/50 one puff in the morning and 1 puff in the evening.  12.DuoNeb 3 times daily as needed.  13.Ventolin 2 puffs 4 times daily.   FOLLOWUP APPOINTMENTS:  1. Vascular testing [facility] on Friday, March 07, 2009, at      8:30.  2. He is to see requested to see Dr. Lorin [facility]      office Monday March 24, 2009 at 2 o'clock  3. To see Dr. Sherren [facility] at the [facility] office, a map      has been drawn, Thursday May 6 at 1:30.   LABORATORIES STUDIES:  pertinent to this admission on March 03, 2009.  Complete blood count white cells 10.1, hemoglobin 15.4, hematocrit 45,  and platelets of 189.  On February 26, 2009, serum electrolytes sodium is  137, potassium 4.3, chloride 102, carbonate 31, glucose is 99, BUN is  12, creatinine 0.83.  The BNP on March 31 is 105 this.      Maple Mirza, [place]      Duke Salvia, MD, [facility]  Electronically Signed    GM/MEDQ  D:  03/04/2009  T:  03/05/2009  Job:  [id number]   cc:   Oretha Milch, MD  Learta Codding, MD,FACC  Veverly Fells. Excell Seltzer, MD  Everardo Beals. Juanda Chance, MD, [facility]

## 2011-04-13 NOTE — Discharge Summary (Signed)
NAME:  Edward Andrews, Edward Andrews NO.:  [id number]   MEDICAL RECORD NO.:  [id number]          PATIENT TYPE:  INP   LOCATION:  4735                         FACILITY:  [facility]   PHYSICIAN:  Verne Carrow, MDDATE OF BIRTH:  04-May-1951   DATE OF ADMISSION:  04/30/2009  DATE OF DISCHARGE:  05/01/2009                               DISCHARGE SUMMARY   PRIMARY CARDIOLOGIST:  Learta Codding, MD, [facility]   DISCHARGE DIAGNOSES:  1. Peripheral vascular disease.      a.     Severe stenosis of right common iliac artery status post       percutaneous transluminal angioplasty and stent placement.      b.     Severe stenosis of left superficial femoral artery in the       proximal portion.  2. Small abdominal aortic aneurysm.   SECONDARY DIAGNOSES:  1. Supraventricular tachycardia with dual atrioventricular nodal      physiology, status post ablation.  2. Nonobstructive coronary artery disease.      a.     Normal left ventricular function.  3. Ongoing tobacco abuse.  4. Chronic obstructive pulmonary disease with chronic bronchitis.  5. Gastroesophageal reflux disease.  6. Morbid obesity.  7. Back/cervical surgeries.  8. Severe nephrolithiasis.  9. Anxiety and depression.   ALLERGIES AND INTOLERANCES:  ASPIRIN (GI upset).   PROCEDURES PERFORMED DURING THIS HOSPITALIZATION:  1. Distal aortogram with bilateral lower extremity runoff.  2. Percutaneous transluminal angioplasty of right common iliac artery      with placement of a stent.   HISTORY OF PRESENT ILLNESS:  Mr. Lachapelle is a 60 year old gentleman with  the above problem list who was admitted to [facility] for  planned distal aortogram and possible PCI with stent placement secondary  to peripheral vascular disease, on April 30, 2009.  The patient has been  complaining of bilateral lower extremity claudication with right thigh  and buttock pain.  The patient also has been complaining of bilateral  lower extremity pain  with moderate exertion.  Due to these symptoms and  noninvasive study that showed high-grade stenosis in the right common  iliac artery and possible stenosis in the bilateral lower extremities,  above procedures were planned.   HOSPITAL COURSE:  The patient admitted and underwent procedures as  described above.  He tolerated them well without any significant  complications.  The patient's vital signs, labs, and symptomatology  assessed, and the patient deemed stable for discharge on May 01, 2009.  The patient had mild itching reaction to Plavix and was given Benadryl  x1.  If the patient continues to have adverse reaction with Plavix use,  he has been instructed to discontinue on his own.  Per Dr. Verne Carrow, no strict recommendations/guidelines regarding Plavix use with  stent placement in large-caliber arteries.  The patient has early  followup scheduled with Dr. Verne Carrow on May 07, 2009.  At  the time of discharge, the patient will be given his medication list,  new prescriptions, including full-strength aspirin and Plavix, followup  instructions, and post  cath instructions.  At that time, all questions  or concerns will be addressed.   DISCHARGE LABORATORY DATA:  WBC 9.6, HGB 14.3, HCT 42.0, PLT count 194.  Sodium 140, potassium 3.8, chloride 103, CO2 of 28, BUN 19, creatinine  0.6, glucose 109, calcium 9.1.   FOLLOWUP PLANS AND APPOINTMENTS:  Dr. Verne Carrow, May 07, 2009.   DISCHARGE MEDICATIONS:  1. Metoprolol 25 mg p.o. b.i.d.  2. Enteric-coated aspirin 325 mg p.o. daily.  3. Clonazepam 0.5 mg p.o. b.i.d. p.r.n. for anxiety.  4. Omeprazole 40 mg p.o. daily.  5. Fish oil 1200 mg p.o. daily.  6. Ventolin 40 mcg 2 puffs b.i.d.  7. Torsemide 20 mg p.o. daily (Held, will be reviewed at followup      visit with Verne Carrow, MD and/or primary cardiologist).  8. Potassium 20 mEq p.o. daily  9. Simvastatin 40 mg p.o. daily.   10.Metoclopramide 10 mg p.o. daily.  11.Oxazepam 30 mg p.o. at bedtime.  12.Citalopram 20 mg p.o. daily.  13.Advair 250/50 one puff b.i.d.  14.DuoNeb 0.5-2.5 one inhale t.i.d.  15.Plavix 75 mg p.o. daily.   DURATION OF DISCHARGE ENCOUNTER:  Thirty-five minutes including  physician time.      Jarrett Ables, [facility]      Verne Carrow, MD  Electronically Signed    MS/MEDQ  D:  05/01/2009  T:  05/01/2009  Job:  [id number]   cc:   Learta Codding, MD,FACC

## 2011-04-13 NOTE — Cardiovascular Report (Signed)
NAME:  Edward Andrews, Edward Andrews NO.:  [id number]   MEDICAL RECORD NO.:  [id number]          PATIENT TYPE:  INP   LOCATION:  4735                         FACILITY:  [facility]   PHYSICIAN:  Verne Carrow, MDDATE OF BIRTH:  December 17, 1950   DATE OF PROCEDURE:  04/30/2009  DATE OF DISCHARGE:                            CARDIAC CATHETERIZATION   PRIMARY CARDIOLOGIST:  Learta Codding, MD, [facility]   PROCEDURES PERFORMED:  1. Distal aortogram with bilateral lower extremity runoff.  2. Percutaneous transluminal angioplasty of the right common iliac      artery with placement of a stent.   OPERATOR:  Verne Carrow, MD   INDICATION:  Bilateral lower extremity claudication with right thigh and  buttock pain.  The patient also complains of bilateral lower extremity  pain with moderate exertion.  Noninvasive study suggested high-grade  stenosis in the right common iliac artery and possible stenosis in the  bilateral lower extremities.   PROCEDURE IN DETAIL:  The patient was brought to the peripheral vascular  laboratory after signing informed consent for the procedure.  Both  groins were prepped and draped in a sterile fashion.  I initially  inserted a 5-French sheath into the left femoral artery.  Lidocaine 1%  was used for local anesthesia.  A pigtail catheter was then inserted  through the sheath and distal aortogram with visualization of the  bilateral iliac and femoral arteries was performed.  At this point in  the case, we elected to proceed to intervention of the right common  iliac stenosis.  I did measure gradient across the stenosis and there  was a 50-mm gradient.  Access was obtained in the right femoral artery.  A 6-French sheath was placed without difficulty.  Through this sheath,  we then used a 7 x 20-mm balloon to predilate the lesion in the right  common iliac artery.  We then placed an 8-mm x 70-mm Express stent in  the right common iliac artery.  There was an  excellent angiographic  result.  There was a small appearance of dye outside of the stent that  appeared to be a small branch and not true extravasation of dye.  At  this point in the case, we did selective lower extremity runoffs through  the respective sheaths in each leg.  The patient tolerated the procedure  well and was taken to the holding area in stable condition.  The patient  was given 600 mg of Plavix here in the cath lab.  He took a full-  strength aspirin earlier this morning.   ANGIOGRAPHIC FINDINGS:  1. The bilateral renal arteries are patent without any significant      stenosis.  2. The infrarenal aorta has diffuse plaque, but no significant      stenosis.  There was a small aneurysm.  3. The right common iliac has a 70% ostial stenosis.  The right      external iliac system has serial 50% lesions.  The right common      femoral artery has no significant stenosis.  The right superficial  femoral artery has an ostial 40% stenosis.  There was diffuse      plaque throughout the remainder of the superficial femoral artery      without any significant stenosis.  There was three-vessel runoff      below the knee in the right leg.  4. The left common iliac artery has plaque.  The left external iliac      artery and common femoral artery has mild plaque.  A left      superficial femoral artery has serial 90% lesions in the proximal      portion starting 1-cm beyond the ostium.  There was mild plaque      throughout the remainder of the left SFA, but no significant      stenosis.  There was three-vessel runoff below the knee in the left      foot.   HEMODYNAMIC DATA:  Central aortic pressure 135/71.   IMPRESSION:  1. Peripheral vascular disease.  2. Severe stenosis of the right common iliac arteries, status post      percutaneous transluminal angioplasty and stent placement.  3. Severe stenosis of the left superficial femoral artery in the      proximal portion.  4.  Small abdominal aortic aneurysm.   RECOMMENDATIONS:  The patient will be continued on aspirin and Plavix on  a daily basis.  We monitored overnight.  We will see him back in the  office in several weeks and then we will arrange a time for him to come  back and have the left superficial femoral artery worked up.  We will  follow him with yearly ultrasounds to follow his infrarenal aortic  aneurysm.      Verne Carrow, MD  Electronically Signed     CM/MEDQ  D:  04/30/2009  T:  04/30/2009  Job:  [id number]   cc:   Learta Codding, MD,FACC

## 2011-04-13 NOTE — Assessment & Plan Note (Signed)
[facility]                          [facility] NOTE   TRAMOND, SLINKER                         MRN:          [id number]  DATE:07/19/2008                            DOB:          [birth year]    Mr. Edward Andrews recently had a nuclear exercise test that was abnormal.  I  discussed this with Dr. Linna Darner and the patient was brought in for further  evaluation.  There has been history of supraventricular tachycardia in  the past.  The patient does have risk factors for coronary disease.  Also he has significant reflux symptoms.  He was recently admitted to  [facility] with some shortness of breath and a fluttering  sensation.  He stabilized.  It appeared that he did have  supraventricular tachycardia with a rate of 170 that converted to sinus.  He was discharged home.  During that hospitalization on July 09, 2008,  he had a 2-D echo.  Ejection fraction was 60-65%.  There was mild aortic  valve sclerosis.  He then had a dobutamine Cardiolite scan arranged and  this was done on July 15, 2008.  There was question of wall motion  abnormalities on that study with an ejection fraction of 40%.  There  also was a reversible defect in the inferolateral wall of moderate size.  I felt that the images were compatible with ischemia in this area.  There also appeared to be some hypokinesis.   The patient is here now.  He is not having chest pain or shortness of  breath.  He has had mild palpitations.  These have been limited.  He  senses these mostly at night time.  He does not have exertional chest  pain or shortness of breath.  He says that he stopped smoking.  His wife  does smoke and his clothes smell of cigarette smoke today.   ALLERGIES:  No known drug allergies.   MEDICATIONS:  1. Metoprolol 50 b.i.d.  2. Simvastatin 40.  3. Lisinopril 20.  4. Advair.  5. Spiriva.  6. Omeprazole b.i.d.  7. Chlordiazepoxide.  8. Citalopram.  9. We will start  aspirin 81 mg today.   OTHER MEDICAL PROBLEMS:  See the list below.   REVIEW OF SYSTEMS:  Other than the HPI, his review of systems is  negative.   PHYSICAL EXAM:  VITAL SIGNS:  Blood pressure is 127/76 with a pulse of  68.  His weight is 289 pounds.  GENERAL:  The patient is oriented to person, time and place.  Affect is  normal.  HEENT:  Reveals no xanthelasma.  He has normal extraocular motion.  NECK:  There are no carotid bruits.  There is no jugular venous  distention.  LUNGS:  Lungs are clear.  Respiratory effort is not labored.  CARDIAC:  Exam reveals an S1 and S2.  There are no clicks or significant  murmurs.  ABDOMEN:  The abdomen is obese.  He has no significant peripheral edema.   EKG today reveals no significant abnormalities.   PROBLEMS:  1. History of paroxysmal supraventricular tachycardia that was  thought      to be AV-nodal reentrant in 2006.  He may well have had a recent      episode that took him to the hospital in early August of 2009.  2. History of an ejection fraction in the 45-50% range with some      evidence of right ventricular dysfunction.  However, his echo in      August of 2009 appeared to show an ejection fraction that was in      the normal range and yet a Cardiolite scan 2 weeks later with an      ejection fraction in the 39% range.  3. Chronic obstructive pulmonary disease  4. History of tobacco use which he says he has stopped.  5. Gastroesophageal reflux disease.  6. Hyperlipidemia on medications.  7. Abnormal Cardiolite scan.  As outlined there is question of a      moderate sized inferolateral ischemic defect.  I had a full      discussion with the patient about proceeding with cardiac      catheterization.  At this point he is hesitant.  I explained to him      that we will do the best we can to follow him and treat him and      watch him carefully.  I will see him for early followup.  If he has      significant symptoms he will be back  in touch.  Otherwise, I will      see him back and will continue to discuss the pros and cons of a      more aggressive workup.  In the meantime he needs to not smoke.  He      needs to continue his beta-blocker.  I have started aspirin.  He is      on simvastatin.  I will see him for followup.     Luis Abed, MD, [facility]  Electronically Signed    JDK/MedQ  DD: 07/19/2008  DT: 07/19/2008  Job #: [id number]   cc:   Erasmo Downer, MD

## 2011-04-13 NOTE — Assessment & Plan Note (Signed)
[facility]                          [facility] NOTE   BRIANNA, ESSON                         MRN:          [id number]  DATE:03/24/2009                            DOB:          February 04, 1951    REFERRING PHYSICIAN:  Erasmo Downer, MD   HISTORY OF PRESENT ILLNESS:  The patient is a 60 year old male with a  history of supraventricular tachycardia, status post ablation for a dual  AV nodal physiology.  The procedure was successful.  The patient has had  no recurrent palpitations.  He also underwent catheterization.  He was  found to have nonobstructive coronary artery disease.  He also had  vascular study scheduled which showed that he has had mildly severe  peripheral vascular disease particularly on the right leg at 0.74 with  possible iliofemoral disease, but also got bilateral below-the-knee  peripheral vascular disease.  The patient indeed reports claudication  with mainly pain in the right buttock, but also in both lower  extremities below the knee on minimal exertion.  He has no chest pain,  palpitations, or syncope.   Unfortunately, he continues to smoke.   MEDICATIONS:  1. Clonazepam 0.5 mg p.o. b.i.d.  2. Metoprolol 25 mg p.o. b.i.d.  3. Torsemide 20 mg p.o. daily.  4. Lisinopril 10 mg p.o. daily.  5. Simvastatin 40 mg p.o. daily.  6. Advair.  7. Omeprazole 20 mg 2 tablets p.o. daily.  8. Oxazepam 30 mg p.o. nightly.  9. Citalopram 20 mg p.o. nightly.  10.Metoclopramide 10 mg p.o. nightly.   PHYSICAL EXAMINATION:  VITAL SIGNS:  Blood pressure is 105/67, heart  rate is 80 beats per minute, respirations 18, and weight is 298 pounds.  NECK:  Normal carotid upstroke and no carotid bruits.  LUNGS:  Clear breath sounds bilaterally.  HEART:  Regular rate and rhythm with normal S1 and S2.  No murmur, rubs,  or gallops.  ABDOMEN:  Soft and nontender.  No rebound or guarding.  Good bowel  sounds.  EXTREMITIES:  No cyanosis or  clubbing.  There is 1+ edema.  Dorsalis  pedis and posterior tibial pulses are not palpable.   PROBLEM LIST:  1. Status post ablation for supraventricular tachycardia with dual      atrioventricular nodal physiology.  2. Nonobstructive coronary artery disease.  3. Normal left ventricular function.  4. Claudication with at least moderate-to-severe right iliofemoral      disease and bilateral below-the-knee peripheral vascular disease.  5. Ongoing tobacco use.  6. Chronic obstructive pulmonary disease with chronic bronchitis.  7. Gastroesophageal reflux disease.  8. Morbid obesity.  9. Back surgeries and cervical surgeries.  10.Severe nephrolithiasis.  11.Anxiety and depression.   PLAN:  1. The patient has significant peripheral vascular disease with      symptoms.  We will obtain a CT of the lower extremities with runoff      to establish, if he has operable vascular disease.  2. I have also asked the patient to resume aspirin 81 mg p.o. daily.  3. The patient will have an appointment with Dr. Excell Seltzer  after his CT      scan.     Learta Codding, MD,FACC  Electronically Signed    GED/MedQ  DD: 03/24/2009  DT: 03/25/2009  Job #: [id number]   cc:   Erasmo Downer, MD

## 2011-04-13 NOTE — Discharge Summary (Signed)
NAME:  Edward Andrews, Edward Andrews NO.:  [id number]   MEDICAL RECORD NO.:  [id number]          PATIENT TYPE:  INP   LOCATION:  4735                         FACILITY:  [facility]   PHYSICIAN:  Verne Carrow, MDDATE OF BIRTH:  02-13-1951   DATE OF ADMISSION:  04/30/2009  DATE OF DISCHARGE:  05/01/2009                               DISCHARGE SUMMARY   ADDENDUM   Per discussion with Dr. Verne Carrow, the patient should  continue on his torsemide 20 mg p.o. daily in contrast what was  previously dictated.  The patient has been contacted and informed of  this change to the plan.  No other changes to the previously dictated  plan.      Jarrett Ables, [facility]      Verne Carrow, MD  Electronically Signed    MS/MEDQ  D:  05/01/2009  T:  05/01/2009  Job:  [id number]

## 2011-04-13 NOTE — Assessment & Plan Note (Signed)
[facility]                          [facility]   Edward Andrews                         MRN:          [id number]  DATE:01/20/2009                            DOB:          19-Jun-1951    HISTORY OF PRESENT ILLNESS:  The patient is a 60 year old male that is  followed by Dr. Myrtis Ser.  Dr. Myrtis Ser in the past was concerned about possible  ischemia and talked about a catheterization, but the patient was  hesitant to do so.  Interestingly, however, he does not have any  substernal chest pain both at rest and on exertion.  He had a Cardiolite  study done last year, which showed some lateral ischemia, but an  echocardiogram showed a normal ejection fraction.  The Cardiolite EF was  apparently 29%.  The patient, however, has no heart failure symptoms.  The patient now reports that he has had palpitations at least for  several months.  He had 2 visits to the emergency room, one 3 weeks ago  and one 1 week ago.  At 3 weeks ago, he was admitted reluctantly so, and  then 1 week ago, he was under observation.  He told me that he came in  with a heart rate that was quite high in the 170s.  At first time in the  emergency room, they gave him adenosine and broke the rhythm.  The  second time, he was given IV Cardizem and also his rhythm broke.  It was  his understanding that the emergency room physician told him on the  second occasion i.e. 1 week ago, that adenosine was not necessary  because this was a rhythm that could not be broken with adenosine.  However, I pulled up all the tracings from both ER visits and they are  all consistent with a supraventricular tachycardia, possibly AV nodal  reentry tachycardia versus AV reentry tachycardia.  The reason I am  seeing the patient actually today is because after that visit Dr. Linna Darner  put a CardioNet monitor on and told me this morning that he thinks Mr.  Andrews needs a pacemaker.  He was found reportedly to  have 3.5-second  pauses on his monitor.  I told Dr. Linna Darner, I would see the patient this  afternoon.  At that point in time, I did not know anything about his  prior medical history.  When I saw the rhythm strips in the office to  longest pause that I can see is 2.7 seconds.  I did not see a single  pause of 3.5 seconds.  Also, the patient with his bradycardia was not  symptomatic.  The other reason likely for his developing bradycardia is  that Cardizem at 180 mg a day was added to his Toprol 50 mg a day.   Looking back in [employer], the patient actually had similar problem in  August 2009, very clearly had a supraventricular tachycardia, that is  when he was referred for an evaluation in this office and I suspect that  is why the stress test was ordered and the echocardiogram.  I am not  sure if there was a miscommunication about the exact symptoms.  I also  do not think that the EKG from that ER visit was reviewed.  I pulled up  all that EKG tracings and I will forward them to Dr. Graciela Husbands, but all the  rhythms are fairly consistent and I do not think this is atrial flutter  and certainly not atrial fibrillation because it is quite irregulars,  narrow QRS tachycardia.  The patient states that these symptoms now  occur very frequently, almost every week or every other week basis,  typically they do not last very long and he actually has learned himself  to do a Valsalva maneuver.  He will bear down and sometimes tilt his  head backwards or rub his carotid artery.  I explained to him the  pathophysiology of this rhythm and told him that he certainly does not  need a pacemaker, but that we can help him with a radio catheter  frequency ablation.  The patient is in agreement with this because he  quotes I am quite frankly very tired of this problem.  The patient now  also has some wheezing on exam.  He has known COPD and unfortunately  continues to smoke.  He did go see Dr. Linna Darner this morning  and received  inhalers and antibiotics.   ALLERGIES:  No known drug allergies.   MEDICATIONS:  1. Metoprolol 50 mg twice a day.  2. Simvastatin 40 mg p.o. daily.  3. Advair.  4. Spiriva.  5. Omeprazole.  6. Oxazepam 30 mg p.o. nightly.  7. Citalopram 20 mg p.o. nightly.  8. Aspirin 81 mg p.o. daily.  9. Diltiazem 180 mg p.o. daily.  10.Metoclopramide 10 mg p.o. nightly.  11.Lisinopril 20 mg p.o. daily.   PHYSICAL EXAMINATION:  VITAL SIGNS:  Blood pressure 117/75, heart rate  60, respirations 22, and weight is 295 pounds.  GENERAL:  Overweight white male, but in no apparent distress.  HEENT:  Pupils are equal.  Conjunctivae clear.  NECK:  Supple.  Normal carotid stroke and no carotid bruits.  LUNGS:  Bilateral wheezing with a prolonged expiration and scattered  rhonchi.  HEART:  Regular rate and rhythm with normal S1 and S2, but no murmurs,  rubs, or gallops.  ABDOMEN:  Soft and nontender.  No rebound or guarding.  Good bowel  sounds.  EXTREMITIES:  No cyanosis, clubbing, or edema.  NEUROLOGIC:  The patient is alert, oriented, grossly nonfocal.   PROBLEM LIST:  1. Narrow QRS tachycardia.      a.     Rule out atrioventricular nodal reentry tachycardia.      b.     Rule out atypical atrioventricular nodal reentry tachycardia       versus atrioventricular reentry tachycardia.  2. Wheezing secondary to chronic obstructive pulmonary disease.  3. Tobacco use.  4. Possible ischemic heart disease based on a Cardiolite study      (however, no evidence of ST-segment depression during rapid heart      rate and the patient denies chest pain).  5. Normal left ventricular function by echo.   PLAN:  1. I had a long discussion with the patient about the possible cause      of his arrhythmia.  He is very understanding of his problem.  He      states that he wants to have this fixed as soon as possible.  I do      agree with this as  the episodes are not quite frequent and is       developing slow heart rates with combination calcium channel      blockers and beta-blockers.  2. I have called Dr. Graciela Husbands today, but he was unavailable and will try      to contact him again tomorrow and notify him about the patient.  3. I suspect Dr. Graciela Husbands will see the patient in consultation at first      or he can just review the EKGs and rhythm strips, and make a      decision based on my note.  I will leave this up to him, however.      In the meantime, no change to the patient's medical therapy and      gave the patient information regarding radiofrequency catheter      ablation, indications, and complications.     Learta Codding, MD,FACC     GED/MedQ  DD: 01/20/2009  DT: 01/21/2009  Job #: [id number]   cc:   Duke Salvia, MD, [facility]

## 2015-06-17 DIAGNOSIS — I10 Essential (primary) hypertension: Secondary | ICD-10-CM | POA: Diagnosis not present

## 2015-06-17 DIAGNOSIS — J440 Chronic obstructive pulmonary disease with acute lower respiratory infection: Secondary | ICD-10-CM | POA: Diagnosis not present

## 2015-09-18 DIAGNOSIS — Z0000 Encounter for general adult medical examination without abnormal findings: Secondary | ICD-10-CM | POA: Diagnosis not present

## 2015-09-18 DIAGNOSIS — J440 Chronic obstructive pulmonary disease with acute lower respiratory infection: Secondary | ICD-10-CM | POA: Diagnosis not present

## 2015-09-18 DIAGNOSIS — I10 Essential (primary) hypertension: Secondary | ICD-10-CM | POA: Diagnosis not present

## 2015-09-22 DIAGNOSIS — Z1211 Encounter for screening for malignant neoplasm of colon: Secondary | ICD-10-CM | POA: Diagnosis not present

## 2015-12-19 DIAGNOSIS — J440 Chronic obstructive pulmonary disease with acute lower respiratory infection: Secondary | ICD-10-CM | POA: Diagnosis not present

## 2015-12-19 DIAGNOSIS — Z6841 Body Mass Index (BMI) 40.0 and over, adult: Secondary | ICD-10-CM | POA: Diagnosis not present

## 2015-12-19 DIAGNOSIS — I10 Essential (primary) hypertension: Secondary | ICD-10-CM | POA: Diagnosis not present

## 2016-03-19 DIAGNOSIS — I10 Essential (primary) hypertension: Secondary | ICD-10-CM | POA: Diagnosis not present

## 2016-03-19 DIAGNOSIS — K210 Gastro-esophageal reflux disease with esophagitis: Secondary | ICD-10-CM | POA: Diagnosis not present

## 2016-03-19 DIAGNOSIS — Z6841 Body Mass Index (BMI) 40.0 and over, adult: Secondary | ICD-10-CM | POA: Diagnosis not present

## 2016-03-19 DIAGNOSIS — F3289 Other specified depressive episodes: Secondary | ICD-10-CM | POA: Diagnosis not present

## 2016-03-19 DIAGNOSIS — J440 Chronic obstructive pulmonary disease with acute lower respiratory infection: Secondary | ICD-10-CM | POA: Diagnosis not present

## 2016-03-19 DIAGNOSIS — F411 Generalized anxiety disorder: Secondary | ICD-10-CM | POA: Diagnosis not present

## 2016-06-29 DIAGNOSIS — K210 Gastro-esophageal reflux disease with esophagitis: Secondary | ICD-10-CM | POA: Diagnosis not present

## 2016-06-29 DIAGNOSIS — Z6841 Body Mass Index (BMI) 40.0 and over, adult: Secondary | ICD-10-CM | POA: Diagnosis not present

## 2016-06-29 DIAGNOSIS — J440 Chronic obstructive pulmonary disease with acute lower respiratory infection: Secondary | ICD-10-CM | POA: Diagnosis not present

## 2016-06-29 DIAGNOSIS — F411 Generalized anxiety disorder: Secondary | ICD-10-CM | POA: Diagnosis not present

## 2016-06-29 DIAGNOSIS — F3289 Other specified depressive episodes: Secondary | ICD-10-CM | POA: Diagnosis not present

## 2016-06-29 DIAGNOSIS — I10 Essential (primary) hypertension: Secondary | ICD-10-CM | POA: Diagnosis not present

## 2016-09-30 DIAGNOSIS — J440 Chronic obstructive pulmonary disease with acute lower respiratory infection: Secondary | ICD-10-CM | POA: Diagnosis not present

## 2016-09-30 DIAGNOSIS — Z1389 Encounter for screening for other disorder: Secondary | ICD-10-CM | POA: Diagnosis not present

## 2016-09-30 DIAGNOSIS — F3289 Other specified depressive episodes: Secondary | ICD-10-CM | POA: Diagnosis not present

## 2016-09-30 DIAGNOSIS — F411 Generalized anxiety disorder: Secondary | ICD-10-CM | POA: Diagnosis not present

## 2016-09-30 DIAGNOSIS — Z125 Encounter for screening for malignant neoplasm of prostate: Secondary | ICD-10-CM | POA: Diagnosis not present

## 2016-09-30 DIAGNOSIS — I10 Essential (primary) hypertension: Secondary | ICD-10-CM | POA: Diagnosis not present

## 2016-09-30 DIAGNOSIS — K210 Gastro-esophageal reflux disease with esophagitis: Secondary | ICD-10-CM | POA: Diagnosis not present

## 2016-09-30 DIAGNOSIS — Z0000 Encounter for general adult medical examination without abnormal findings: Secondary | ICD-10-CM | POA: Diagnosis not present

## 2016-09-30 DIAGNOSIS — Z6841 Body Mass Index (BMI) 40.0 and over, adult: Secondary | ICD-10-CM | POA: Diagnosis not present

## 2016-10-08 DIAGNOSIS — I714 Abdominal aortic aneurysm, without rupture: Secondary | ICD-10-CM | POA: Diagnosis not present

## 2016-10-08 DIAGNOSIS — Z8489 Family history of other specified conditions: Secondary | ICD-10-CM | POA: Diagnosis not present

## 2016-10-08 DIAGNOSIS — Z136 Encounter for screening for cardiovascular disorders: Secondary | ICD-10-CM | POA: Diagnosis not present

## 2016-10-08 DIAGNOSIS — Z87891 Personal history of nicotine dependence: Secondary | ICD-10-CM | POA: Diagnosis not present

## 2016-12-31 DIAGNOSIS — F17290 Nicotine dependence, other tobacco product, uncomplicated: Secondary | ICD-10-CM | POA: Diagnosis not present

## 2016-12-31 DIAGNOSIS — F3289 Other specified depressive episodes: Secondary | ICD-10-CM | POA: Diagnosis not present

## 2016-12-31 DIAGNOSIS — Z79899 Other long term (current) drug therapy: Secondary | ICD-10-CM | POA: Diagnosis not present

## 2016-12-31 DIAGNOSIS — J440 Chronic obstructive pulmonary disease with acute lower respiratory infection: Secondary | ICD-10-CM | POA: Diagnosis not present

## 2016-12-31 DIAGNOSIS — Z6841 Body Mass Index (BMI) 40.0 and over, adult: Secondary | ICD-10-CM | POA: Diagnosis not present

## 2016-12-31 DIAGNOSIS — I10 Essential (primary) hypertension: Secondary | ICD-10-CM | POA: Diagnosis not present

## 2016-12-31 DIAGNOSIS — K210 Gastro-esophageal reflux disease with esophagitis: Secondary | ICD-10-CM | POA: Diagnosis not present

## 2016-12-31 DIAGNOSIS — F411 Generalized anxiety disorder: Secondary | ICD-10-CM | POA: Diagnosis not present

## 2017-01-07 DIAGNOSIS — R0602 Shortness of breath: Secondary | ICD-10-CM | POA: Diagnosis not present

## 2017-02-22 DIAGNOSIS — R0602 Shortness of breath: Secondary | ICD-10-CM | POA: Diagnosis not present

## 2017-02-22 DIAGNOSIS — J410 Simple chronic bronchitis: Secondary | ICD-10-CM | POA: Diagnosis not present

## 2017-02-22 DIAGNOSIS — E6609 Other obesity due to excess calories: Secondary | ICD-10-CM | POA: Diagnosis not present

## 2017-02-22 DIAGNOSIS — G4700 Insomnia, unspecified: Secondary | ICD-10-CM | POA: Diagnosis not present

## 2017-02-22 DIAGNOSIS — Z720 Tobacco use: Secondary | ICD-10-CM | POA: Diagnosis not present

## 2017-03-24 DIAGNOSIS — R0602 Shortness of breath: Secondary | ICD-10-CM | POA: Diagnosis not present

## 2017-03-24 DIAGNOSIS — G4700 Insomnia, unspecified: Secondary | ICD-10-CM | POA: Diagnosis not present

## 2017-03-24 DIAGNOSIS — Z720 Tobacco use: Secondary | ICD-10-CM | POA: Diagnosis not present

## 2017-03-24 DIAGNOSIS — J410 Simple chronic bronchitis: Secondary | ICD-10-CM | POA: Diagnosis not present

## 2017-04-05 DIAGNOSIS — J440 Chronic obstructive pulmonary disease with acute lower respiratory infection: Secondary | ICD-10-CM | POA: Diagnosis not present

## 2017-04-05 DIAGNOSIS — K210 Gastro-esophageal reflux disease with esophagitis: Secondary | ICD-10-CM | POA: Diagnosis not present

## 2017-04-05 DIAGNOSIS — I10 Essential (primary) hypertension: Secondary | ICD-10-CM | POA: Diagnosis not present

## 2017-04-05 DIAGNOSIS — M545 Low back pain: Secondary | ICD-10-CM | POA: Diagnosis not present

## 2017-04-05 DIAGNOSIS — F411 Generalized anxiety disorder: Secondary | ICD-10-CM | POA: Diagnosis not present

## 2017-04-05 DIAGNOSIS — Z6838 Body mass index (BMI) 38.0-38.9, adult: Secondary | ICD-10-CM | POA: Diagnosis not present

## 2017-04-05 DIAGNOSIS — F17290 Nicotine dependence, other tobacco product, uncomplicated: Secondary | ICD-10-CM | POA: Diagnosis not present

## 2017-04-05 DIAGNOSIS — B358 Other dermatophytoses: Secondary | ICD-10-CM | POA: Diagnosis not present

## 2017-04-05 DIAGNOSIS — F3289 Other specified depressive episodes: Secondary | ICD-10-CM | POA: Diagnosis not present

## 2017-04-08 DIAGNOSIS — M5137 Other intervertebral disc degeneration, lumbosacral region: Secondary | ICD-10-CM | POA: Diagnosis not present

## 2017-04-08 DIAGNOSIS — M5126 Other intervertebral disc displacement, lumbar region: Secondary | ICD-10-CM | POA: Diagnosis not present

## 2017-04-08 DIAGNOSIS — M9983 Other biomechanical lesions of lumbar region: Secondary | ICD-10-CM | POA: Diagnosis not present

## 2017-05-23 DIAGNOSIS — E785 Hyperlipidemia, unspecified: Secondary | ICD-10-CM | POA: Diagnosis not present

## 2017-05-23 DIAGNOSIS — L03115 Cellulitis of right lower limb: Secondary | ICD-10-CM | POA: Diagnosis not present

## 2017-05-23 DIAGNOSIS — L03314 Cellulitis of groin: Secondary | ICD-10-CM | POA: Diagnosis not present

## 2017-05-23 DIAGNOSIS — B372 Candidiasis of skin and nail: Secondary | ICD-10-CM | POA: Diagnosis not present

## 2017-05-23 DIAGNOSIS — L03116 Cellulitis of left lower limb: Secondary | ICD-10-CM | POA: Diagnosis not present

## 2017-05-23 DIAGNOSIS — K650 Generalized (acute) peritonitis: Secondary | ICD-10-CM | POA: Diagnosis not present

## 2017-05-23 DIAGNOSIS — S299XXA Unspecified injury of thorax, initial encounter: Secondary | ICD-10-CM | POA: Diagnosis not present

## 2017-05-23 DIAGNOSIS — J441 Chronic obstructive pulmonary disease with (acute) exacerbation: Secondary | ICD-10-CM | POA: Diagnosis not present

## 2017-05-23 DIAGNOSIS — E876 Hypokalemia: Secondary | ICD-10-CM | POA: Diagnosis not present

## 2017-05-23 DIAGNOSIS — S3993XA Unspecified injury of pelvis, initial encounter: Secondary | ICD-10-CM | POA: Diagnosis not present

## 2017-05-23 DIAGNOSIS — L308 Other specified dermatitis: Secondary | ICD-10-CM | POA: Diagnosis not present

## 2017-05-23 DIAGNOSIS — M5126 Other intervertebral disc displacement, lumbar region: Secondary | ICD-10-CM | POA: Diagnosis not present

## 2017-05-23 DIAGNOSIS — R69 Illness, unspecified: Secondary | ICD-10-CM | POA: Diagnosis not present

## 2017-05-23 DIAGNOSIS — N492 Inflammatory disorders of scrotum: Secondary | ICD-10-CM | POA: Diagnosis not present

## 2017-06-22 DIAGNOSIS — J410 Simple chronic bronchitis: Secondary | ICD-10-CM | POA: Diagnosis not present

## 2017-06-22 DIAGNOSIS — E6609 Other obesity due to excess calories: Secondary | ICD-10-CM | POA: Diagnosis not present

## 2017-06-22 DIAGNOSIS — R0602 Shortness of breath: Secondary | ICD-10-CM | POA: Diagnosis not present

## 2017-06-22 DIAGNOSIS — Z720 Tobacco use: Secondary | ICD-10-CM | POA: Diagnosis not present

## 2017-07-01 DIAGNOSIS — G959 Disease of spinal cord, unspecified: Secondary | ICD-10-CM | POA: Diagnosis not present

## 2017-07-01 DIAGNOSIS — M5126 Other intervertebral disc displacement, lumbar region: Secondary | ICD-10-CM | POA: Diagnosis not present

## 2017-07-07 DIAGNOSIS — F411 Generalized anxiety disorder: Secondary | ICD-10-CM | POA: Diagnosis not present

## 2017-07-07 DIAGNOSIS — F17290 Nicotine dependence, other tobacco product, uncomplicated: Secondary | ICD-10-CM | POA: Diagnosis not present

## 2017-07-07 DIAGNOSIS — F3289 Other specified depressive episodes: Secondary | ICD-10-CM | POA: Diagnosis not present

## 2017-07-07 DIAGNOSIS — J440 Chronic obstructive pulmonary disease with acute lower respiratory infection: Secondary | ICD-10-CM | POA: Diagnosis not present

## 2017-07-07 DIAGNOSIS — M545 Low back pain: Secondary | ICD-10-CM | POA: Diagnosis not present

## 2017-07-07 DIAGNOSIS — I10 Essential (primary) hypertension: Secondary | ICD-10-CM | POA: Diagnosis not present

## 2017-07-07 DIAGNOSIS — B358 Other dermatophytoses: Secondary | ICD-10-CM | POA: Diagnosis not present

## 2017-07-07 DIAGNOSIS — K210 Gastro-esophageal reflux disease with esophagitis: Secondary | ICD-10-CM | POA: Diagnosis not present

## 2017-07-13 ENCOUNTER — Other Ambulatory Visit: Payer: Self-pay | Admitting: Neurosurgery

## 2017-07-13 DIAGNOSIS — G959 Disease of spinal cord, unspecified: Secondary | ICD-10-CM

## 2017-07-30 ENCOUNTER — Other Ambulatory Visit: Payer: Self-pay

## 2017-08-11 ENCOUNTER — Ambulatory Visit
Admission: RE | Admit: 2017-08-11 | Discharge: 2017-08-11 | Disposition: A | Discharge status: Home / Self Care | Payer: Medicare HMO | Source: Ambulatory Visit | Attending: Neurosurgery | Admitting: Neurosurgery

## 2017-08-11 DIAGNOSIS — G959 Disease of spinal cord, unspecified: Secondary | ICD-10-CM

## 2017-08-11 DIAGNOSIS — M4802 Spinal stenosis, cervical region: Secondary | ICD-10-CM | POA: Diagnosis not present

## 2017-08-11 MED ORDER — GADOBENATE DIMEGLUMINE 529 MG/ML IV SOLN
20.0000 mL | Freq: Once | INTRAVENOUS | Status: AC | PRN
  Administered 2017-08-11: 20 mL via INTRAVENOUS

## 2017-08-12 ENCOUNTER — Other Ambulatory Visit: Payer: Medicare HMO

## 2017-08-19 DIAGNOSIS — M4712 Other spondylosis with myelopathy, cervical region: Secondary | ICD-10-CM | POA: Diagnosis not present

## 2017-09-06 ENCOUNTER — Ambulatory Visit: Payer: Medicare HMO | Admitting: Cardiology

## 2017-09-06 ENCOUNTER — Encounter: Payer: Self-pay | Admitting: Cardiology

## 2017-09-06 NOTE — Progress Notes (Deleted)
Cardiology Office Note  Date: 09/06/2017   ID: Edward Andrews, DOB 1951-03-20, MRN [id number]  PCP: Patient, No Pcp Per  Primary Cardiologist: Nona Dell, MD   No chief complaint on file.   History of Present Illness: Edward Andrews is a 66 y.o. male former patient of Dr. Andee Lineman not seen in several years, now referred to the office by Dr. Channing Mutters for cardiology consultation for preoperative cardiac evaluation. Records indicate diagnosis of cervical spondylosis with myelopathy and plan for surgical decompression and stabilization.  Based on records patient has no clear history of obstructive CAD or myocardial infarction. He does have a history of AVNRT and underwent successful ablation back in 2010. Also has peripheral arterial disease with prior stent intervention to the right common iliac artery in 2010.  Past Medical History:  Diagnosis Date  . Anxiety and depression   . AVNRT (AV nodal re-entry tachycardia) (HCC)    Status post ablation 2010  . COPD (chronic obstructive pulmonary disease) (HCC)   . GERD (gastroesophageal reflux disease)   . History of nephrolithiasis   . Mixed hyperlipidemia   . Obstructive sleep apnea   . Peripheral arterial disease (HCC)    Status post PTCA/stent to right common iliac artery    Past Surgical History:  Procedure Laterality Date  . BACK SURGERY    . Kidney stone removal      Current Outpatient Prescriptions  Medication Sig Dispense Refill  . albuterol (ACCUNEB) 0.63 MG/3ML nebulizer solution Take 1 ampule by nebulization every 6 (six) hours as needed for wheezing.    Marland Kitchen albuterol (PROVENTIL HFA;VENTOLIN HFA) 108 (90 Base) MCG/ACT inhaler Inhale into the lungs every 6 (six) hours as needed for wheezing or shortness of breath.    Marland Kitchen atorvastatin (LIPITOR) 20 MG tablet Take 20 mg by mouth daily.    . citalopram (CELEXA) 20 MG tablet TAKE 1 TABLET BY MOUTH EVERY MORNING 15 tablet 0  . furosemide (LASIX) 40 MG tablet Take 40 mg by mouth.    .  gabapentin (NEURONTIN) 300 MG capsule Take 300 mg by mouth 3 (three) times daily.    Marland Kitchen LORazepam (ATIVAN) 1 MG tablet Take 1 mg by mouth every 8 (eight) hours.    . metoprolol succinate (TOPROL-XL) 50 MG 24 hr tablet Take 50 mg by mouth daily. Take with or immediately following a meal.    . potassium chloride (K-DUR) 10 MEQ tablet Take 10 mEq by mouth daily.    . ranitidine (ZANTAC) 300 MG capsule Take 300 mg by mouth every evening.     No current facility-administered medications for this visit.    Allergies:  Patient has no allergy information on record.   Social History: The patient  reports that he has been smoking Cigarettes.  He has never used smokeless tobacco. He reports that he drinks alcohol. He reports that he does not use drugs.   Family History: The patient's family history includes Brain cancer in his brother; Lung cancer in his mother; Stomach cancer in his father; Stroke in his brother.   ROS:  Please see the history of present illness. Otherwise, complete review of systems is positive for {NONE DEFAULTED:18576::"none"}.  All other systems are reviewed and negative.   Physical Exam: VS:  There were no vitals taken for this visit., BMI There is no height or weight on file to calculate BMI.  Wt Readings from Last 3 Encounters:  01/15/10 (!) 305 lb (138.3 kg)  12/29/09 (!) 319 lb 8  oz (144.9 kg)  12/26/09 (!) 327 lb 4 oz (148.4 kg)    General: Patient appears comfortable at rest. HEENT: Conjunctiva and lids normal, oropharynx clear with moist mucosa. Neck: Supple, no elevated JVP or carotid bruits, no thyromegaly. Lungs: Clear to auscultation, nonlabored breathing at rest. Cardiac: Regular rate and rhythm, no S3 or significant systolic murmur, no pericardial rub. Abdomen: Soft, nontender, no hepatomegaly, bowel sounds present, no guarding or rebound. Extremities: No pitting edema, distal pulses 2+. Skin: Warm and dry. Musculoskeletal: No kyphosis. Neuropsychiatric: Alert  and oriented x3, affect grossly appropriate.  ECG: I personally reviewed the tracing from 12/26/2009 which showed normal sinus rhythm.  Recent Labwork:  No recent labwork available for review.  Other Studies Reviewed Today:  Echocardiogram 12/26/2009: Mild LVH with LVEF 55-60%, no wall motion abnormalities, slightly dilated right ventricle, trace mitral regurgitation, physiologic tricuspid regurgitation  Assessment and Plan:    Current medicines were reviewed with the patient today.  No orders of the defined types were placed in this encounter.   Disposition:  Signed, Jonelle Sidle, MD, [facility] 09/06/2017 8:47 AM    [facility] at [facility] [address] [place], [place], [place] [address] Phone: [phone]; Fax: [phone]

## 2017-09-07 ENCOUNTER — Ambulatory Visit (INDEPENDENT_AMBULATORY_CARE_PROVIDER_SITE_OTHER): Payer: Medicare HMO | Admitting: Cardiovascular Disease

## 2017-09-07 ENCOUNTER — Encounter: Payer: Self-pay | Admitting: Cardiovascular Disease

## 2017-09-07 ENCOUNTER — Inpatient Hospital Stay (HOSPITAL_COMMUNITY)
Admission: EM | Admit: 2017-09-07 | Discharge: 2017-09-17 | DRG: 292 | Disposition: A | Discharge status: Home Health | Payer: Medicare HMO | Attending: Internal Medicine | Admitting: Internal Medicine

## 2017-09-07 ENCOUNTER — Emergency Department (HOSPITAL_COMMUNITY): Payer: Medicare HMO

## 2017-09-07 ENCOUNTER — Encounter (HOSPITAL_COMMUNITY): Payer: Self-pay

## 2017-09-07 VITALS — BP 150/104 | HR 64 | Ht 72.0 in | Wt 319.0 lb

## 2017-09-07 DIAGNOSIS — D649 Anemia, unspecified: Secondary | ICD-10-CM | POA: Diagnosis present

## 2017-09-07 DIAGNOSIS — I739 Peripheral vascular disease, unspecified: Secondary | ICD-10-CM

## 2017-09-07 DIAGNOSIS — R600 Localized edema: Secondary | ICD-10-CM

## 2017-09-07 DIAGNOSIS — Z01818 Encounter for other preprocedural examination: Secondary | ICD-10-CM | POA: Diagnosis not present

## 2017-09-07 DIAGNOSIS — Y92230 Patient room in hospital as the place of occurrence of the external cause: Secondary | ICD-10-CM | POA: Diagnosis not present

## 2017-09-07 DIAGNOSIS — I4892 Unspecified atrial flutter: Secondary | ICD-10-CM | POA: Diagnosis not present

## 2017-09-07 DIAGNOSIS — I509 Heart failure, unspecified: Secondary | ICD-10-CM

## 2017-09-07 DIAGNOSIS — N5089 Other specified disorders of the male genital organs: Secondary | ICD-10-CM | POA: Diagnosis present

## 2017-09-07 DIAGNOSIS — F17200 Nicotine dependence, unspecified, uncomplicated: Secondary | ICD-10-CM | POA: Diagnosis present

## 2017-09-07 DIAGNOSIS — F329 Major depressive disorder, single episode, unspecified: Secondary | ICD-10-CM | POA: Diagnosis present

## 2017-09-07 DIAGNOSIS — G8929 Other chronic pain: Secondary | ICD-10-CM | POA: Diagnosis present

## 2017-09-07 DIAGNOSIS — R0609 Other forms of dyspnea: Secondary | ICD-10-CM

## 2017-09-07 DIAGNOSIS — I4891 Unspecified atrial fibrillation: Secondary | ICD-10-CM | POA: Diagnosis present

## 2017-09-07 DIAGNOSIS — Z95828 Presence of other vascular implants and grafts: Secondary | ICD-10-CM

## 2017-09-07 DIAGNOSIS — I483 Typical atrial flutter: Secondary | ICD-10-CM | POA: Diagnosis present

## 2017-09-07 DIAGNOSIS — G4733 Obstructive sleep apnea (adult) (pediatric): Secondary | ICD-10-CM | POA: Diagnosis not present

## 2017-09-07 DIAGNOSIS — F17210 Nicotine dependence, cigarettes, uncomplicated: Secondary | ICD-10-CM | POA: Diagnosis present

## 2017-09-07 DIAGNOSIS — I361 Nonrheumatic tricuspid (valve) insufficiency: Secondary | ICD-10-CM | POA: Diagnosis not present

## 2017-09-07 DIAGNOSIS — Z716 Tobacco abuse counseling: Secondary | ICD-10-CM | POA: Diagnosis not present

## 2017-09-07 DIAGNOSIS — I5023 Acute on chronic systolic (congestive) heart failure: Secondary | ICD-10-CM | POA: Diagnosis not present

## 2017-09-07 DIAGNOSIS — Z7901 Long term (current) use of anticoagulants: Secondary | ICD-10-CM | POA: Diagnosis not present

## 2017-09-07 DIAGNOSIS — I110 Hypertensive heart disease with heart failure: Secondary | ICD-10-CM | POA: Diagnosis not present

## 2017-09-07 DIAGNOSIS — Z79899 Other long term (current) drug therapy: Secondary | ICD-10-CM

## 2017-09-07 DIAGNOSIS — I5043 Acute on chronic combined systolic (congestive) and diastolic (congestive) heart failure: Secondary | ICD-10-CM | POA: Diagnosis not present

## 2017-09-07 DIAGNOSIS — Z955 Presence of coronary angioplasty implant and graft: Secondary | ICD-10-CM

## 2017-09-07 DIAGNOSIS — Z6841 Body Mass Index (BMI) 40.0 and over, adult: Secondary | ICD-10-CM

## 2017-09-07 DIAGNOSIS — E860 Dehydration: Secondary | ICD-10-CM | POA: Diagnosis not present

## 2017-09-07 DIAGNOSIS — F410 Panic disorder [episodic paroxysmal anxiety] without agoraphobia: Secondary | ICD-10-CM | POA: Diagnosis present

## 2017-09-07 DIAGNOSIS — N289 Disorder of kidney and ureter, unspecified: Secondary | ICD-10-CM | POA: Diagnosis not present

## 2017-09-07 DIAGNOSIS — I5033 Acute on chronic diastolic (congestive) heart failure: Secondary | ICD-10-CM | POA: Diagnosis not present

## 2017-09-07 DIAGNOSIS — I358 Other nonrheumatic aortic valve disorders: Secondary | ICD-10-CM | POA: Diagnosis present

## 2017-09-07 DIAGNOSIS — E876 Hypokalemia: Secondary | ICD-10-CM | POA: Diagnosis present

## 2017-09-07 DIAGNOSIS — M542 Cervicalgia: Secondary | ICD-10-CM | POA: Diagnosis present

## 2017-09-07 DIAGNOSIS — I10 Essential (primary) hypertension: Secondary | ICD-10-CM | POA: Diagnosis not present

## 2017-09-07 DIAGNOSIS — F419 Anxiety disorder, unspecified: Secondary | ICD-10-CM | POA: Diagnosis present

## 2017-09-07 DIAGNOSIS — K219 Gastro-esophageal reflux disease without esophagitis: Secondary | ICD-10-CM | POA: Diagnosis present

## 2017-09-07 DIAGNOSIS — N179 Acute kidney failure, unspecified: Secondary | ICD-10-CM | POA: Diagnosis not present

## 2017-09-07 DIAGNOSIS — K5909 Other constipation: Secondary | ICD-10-CM | POA: Diagnosis present

## 2017-09-07 DIAGNOSIS — E782 Mixed hyperlipidemia: Secondary | ICD-10-CM | POA: Diagnosis present

## 2017-09-07 DIAGNOSIS — R7989 Other specified abnormal findings of blood chemistry: Secondary | ICD-10-CM | POA: Diagnosis not present

## 2017-09-07 DIAGNOSIS — M549 Dorsalgia, unspecified: Secondary | ICD-10-CM | POA: Diagnosis present

## 2017-09-07 DIAGNOSIS — J449 Chronic obstructive pulmonary disease, unspecified: Secondary | ICD-10-CM | POA: Diagnosis present

## 2017-09-07 DIAGNOSIS — R0602 Shortness of breath: Secondary | ICD-10-CM | POA: Diagnosis not present

## 2017-09-07 DIAGNOSIS — R001 Bradycardia, unspecified: Secondary | ICD-10-CM | POA: Diagnosis not present

## 2017-09-07 DIAGNOSIS — T447X5A Adverse effect of beta-adrenoreceptor antagonists, initial encounter: Secondary | ICD-10-CM | POA: Diagnosis not present

## 2017-09-07 DIAGNOSIS — T464X5A Adverse effect of angiotensin-converting-enzyme inhibitors, initial encounter: Secondary | ICD-10-CM | POA: Diagnosis not present

## 2017-09-07 DIAGNOSIS — I340 Nonrheumatic mitral (valve) insufficiency: Secondary | ICD-10-CM | POA: Diagnosis not present

## 2017-09-07 DIAGNOSIS — T502X5A Adverse effect of carbonic-anhydrase inhibitors, benzothiadiazides and other diuretics, initial encounter: Secondary | ICD-10-CM | POA: Diagnosis not present

## 2017-09-07 HISTORY — DX: Panic disorder (episodic paroxysmal anxiety): F41.0

## 2017-09-07 LAB — BASIC METABOLIC PANEL
ANION GAP: 9 (ref 5–15)
BUN: 12 mg/dL (ref 6–20)
CALCIUM: 9.1 mg/dL (ref 8.9–10.3)
CO2: 32 mmol/L (ref 22–32)
Chloride: 96 mmol/L — ABNORMAL LOW (ref 101–111)
Creatinine, Ser: 0.97 mg/dL (ref 0.61–1.24)
Glucose, Bld: 135 mg/dL — ABNORMAL HIGH (ref 65–99)
POTASSIUM: 2.6 mmol/L — AB (ref 3.5–5.1)
Sodium: 137 mmol/L (ref 135–145)

## 2017-09-07 LAB — TROPONIN I: TROPONIN I: 0.09 ng/mL — AB (ref <0.03)

## 2017-09-07 LAB — CBC
HEMATOCRIT: 42.4 % (ref 39.0–52.0)
Hemoglobin: 13.7 g/dL (ref 13.0–17.0)
MCH: 31.8 pg (ref 26.0–34.0)
MCHC: 32.3 g/dL (ref 30.0–36.0)
MCV: 98.4 fL (ref 78.0–100.0)
Platelets: 181 10*3/uL (ref 150–400)
RBC: 4.31 MIL/uL (ref 4.22–5.81)
RDW: 16 % — ABNORMAL HIGH (ref 11.5–15.5)
WBC: 7.9 10*3/uL (ref 4.0–10.5)

## 2017-09-07 LAB — MAGNESIUM: MAGNESIUM: 2 mg/dL (ref 1.7–2.4)

## 2017-09-07 LAB — PROTIME-INR
INR: 1.11
Prothrombin Time: 14.2 seconds (ref 11.4–15.2)

## 2017-09-07 LAB — TSH: TSH: 1.345 u[IU]/mL (ref 0.350–4.500)

## 2017-09-07 LAB — BRAIN NATRIURETIC PEPTIDE: B NATRIURETIC PEPTIDE 5: 747 pg/mL — AB (ref 0.0–100.0)

## 2017-09-07 LAB — I-STAT TROPONIN, ED: Troponin i, poc: 0.09 ng/mL (ref 0.00–0.08)

## 2017-09-07 MED ORDER — ACETAMINOPHEN 325 MG PO TABS
650.0000 mg | ORAL_TABLET | ORAL | Status: DC | PRN
  Administered 2017-09-09 – 2017-09-16 (×7): 650 mg via ORAL
  Filled 2017-09-07 (×7): qty 2

## 2017-09-07 MED ORDER — LORAZEPAM 1 MG PO TABS
1.0000 mg | ORAL_TABLET | Freq: Three times a day (TID) | ORAL | Status: DC
  Administered 2017-09-07 – 2017-09-17 (×26): 1 mg via ORAL
  Filled 2017-09-07 (×29): qty 1

## 2017-09-07 MED ORDER — ASPIRIN EC 81 MG PO TBEC
81.0000 mg | DELAYED_RELEASE_TABLET | Freq: Every day | ORAL | Status: DC
  Administered 2017-09-07: 81 mg via ORAL
  Filled 2017-09-07: qty 1

## 2017-09-07 MED ORDER — UMECLIDINIUM BROMIDE 62.5 MCG/INH IN AEPB
1.0000 | INHALATION_SPRAY | Freq: Every day | RESPIRATORY_TRACT | Status: DC
  Administered 2017-09-08 – 2017-09-17 (×10): 1 via RESPIRATORY_TRACT
  Filled 2017-09-07 (×2): qty 7

## 2017-09-07 MED ORDER — POTASSIUM CHLORIDE CRYS ER 20 MEQ PO TBCR
40.0000 meq | EXTENDED_RELEASE_TABLET | Freq: Once | ORAL | Status: AC
  Administered 2017-09-07: 40 meq via ORAL
  Filled 2017-09-07: qty 2

## 2017-09-07 MED ORDER — SODIUM CHLORIDE 0.9% FLUSH: 3.0000 mL | INTRAVENOUS | Status: DC | PRN

## 2017-09-07 MED ORDER — SODIUM CHLORIDE 0.9 % IV SOLN: 250.0000 mL | INTRAVENOUS | Status: DC | PRN

## 2017-09-07 MED ORDER — METOPROLOL TARTRATE 50 MG PO TABS
50.0000 mg | ORAL_TABLET | Freq: Two times a day (BID) | ORAL | Status: DC
  Administered 2017-09-07 – 2017-09-09 (×4): 50 mg via ORAL
  Filled 2017-09-07 (×4): qty 1

## 2017-09-07 MED ORDER — CITALOPRAM HYDROBROMIDE 20 MG PO TABS
20.0000 mg | ORAL_TABLET | Freq: Every morning | ORAL | Status: DC
  Administered 2017-09-08 – 2017-09-17 (×10): 20 mg via ORAL
  Filled 2017-09-07 (×10): qty 1

## 2017-09-07 MED ORDER — LISINOPRIL 5 MG PO TABS
5.0000 mg | ORAL_TABLET | Freq: Every day | ORAL | Status: DC
  Administered 2017-09-07 – 2017-09-11 (×5): 5 mg via ORAL
  Filled 2017-09-07 (×5): qty 1

## 2017-09-07 MED ORDER — POTASSIUM CHLORIDE CRYS ER 20 MEQ PO TBCR
40.0000 meq | EXTENDED_RELEASE_TABLET | Freq: Once | ORAL | Status: AC
  Administered 2017-09-08: 40 meq via ORAL
  Filled 2017-09-07: qty 2

## 2017-09-07 MED ORDER — FAMOTIDINE 20 MG PO TABS
40.0000 mg | ORAL_TABLET | Freq: Every day | ORAL | Status: DC
  Administered 2017-09-07 – 2017-09-16 (×10): 40 mg via ORAL
  Filled 2017-09-07 (×10): qty 2

## 2017-09-07 MED ORDER — SODIUM CHLORIDE 0.9% FLUSH
3.0000 mL | Freq: Two times a day (BID) | INTRAVENOUS | Status: DC
  Administered 2017-09-07 – 2017-09-17 (×14): 3 mL via INTRAVENOUS

## 2017-09-07 MED ORDER — FUROSEMIDE 10 MG/ML IJ SOLN
40.0000 mg | Freq: Two times a day (BID) | INTRAMUSCULAR | Status: DC
  Administered 2017-09-08: 40 mg via INTRAVENOUS
  Filled 2017-09-07: qty 4

## 2017-09-07 MED ORDER — ENOXAPARIN SODIUM 150 MG/ML ~~LOC~~ SOLN
1.0000 mg/kg | Freq: Two times a day (BID) | SUBCUTANEOUS | Status: DC
  Administered 2017-09-07: 145 mg via SUBCUTANEOUS
  Filled 2017-09-07: qty 0.97
  Filled 2017-09-07: qty 1
  Filled 2017-09-07 (×3): qty 0.97

## 2017-09-07 MED ORDER — GABAPENTIN 300 MG PO CAPS
300.0000 mg | ORAL_CAPSULE | Freq: Three times a day (TID) | ORAL | Status: DC
  Administered 2017-09-07 – 2017-09-17 (×28): 300 mg via ORAL
  Filled 2017-09-07 (×29): qty 1

## 2017-09-07 MED ORDER — ALBUTEROL SULFATE (2.5 MG/3ML) 0.083% IN NEBU
3.0000 mL | INHALATION_SOLUTION | Freq: Four times a day (QID) | RESPIRATORY_TRACT | Status: DC | PRN
Start: 2017-09-07 — End: 2017-09-17

## 2017-09-07 MED ORDER — POTASSIUM CHLORIDE 10 MEQ/100ML IV SOLN
10.0000 meq | Freq: Once | INTRAVENOUS | Status: DC
  Administered 2017-09-07: 10 meq via INTRAVENOUS
  Filled 2017-09-07: qty 100

## 2017-09-07 MED ORDER — ONDANSETRON HCL 4 MG/2ML IJ SOLN: 4.0000 mg | Freq: Four times a day (QID) | INTRAMUSCULAR | Status: DC | PRN

## 2017-09-07 MED ORDER — NICOTINE 14 MG/24HR TD PT24
14.0000 mg | MEDICATED_PATCH | Freq: Every day | TRANSDERMAL | Status: DC
  Administered 2017-09-14: 14 mg via TRANSDERMAL
  Filled 2017-09-07 (×8): qty 1

## 2017-09-07 MED ORDER — POTASSIUM CHLORIDE CRYS ER 10 MEQ PO TBCR
20.0000 meq | EXTENDED_RELEASE_TABLET | Freq: Every day | ORAL | Status: DC
  Filled 2017-09-07 (×2): qty 2

## 2017-09-07 NOTE — ED Notes (Signed)
Pt is unable to tolerate potassium despite the mild increase in NS and potassium turned down to 104ml/hr. Per hospitalist, cancel iv potassium and give po

## 2017-09-07 NOTE — ED Notes (Signed)
Dr. Yates at bedside.  

## 2017-09-07 NOTE — ED Provider Notes (Signed)
[facility]-EMERGENCY DEPT Provider Note   CSN: [id number] Arrival date & time: 09/07/17  1500     History   Chief Complaint Chief Complaint  Patient presents with  . Groin Swelling  . Leg Swelling    HPI Edward Andrews is a 66 y.o. male.  HPI Patient was sent in from cardiology for admission.worsening shortness of breath over the last weeks. Has had swelling in the groin. Still has been able to urinate. He cannot lay flat. He has swelling in his legs.he states he cannot see his penis. His weight is up around 25 pounds in the last 3 months. Has chronic neck and back pain is scheduled for surgery with Dr. Tia Masker states he is worried he may not be limited done now. Found to have atrial flutter on EKG. States she has not had this before. No fevers. States a few months ago he had a cellulitis as groin was treated at [facility]. Past Medical History:  Diagnosis Date  . Anxiety and depression   . AVNRT (AV nodal re-entry tachycardia) (HCC)    Status post ablation 2010  . COPD (chronic obstructive pulmonary disease) (HCC)   . GERD (gastroesophageal reflux disease)   . History of nephrolithiasis   . Mixed hyperlipidemia   . Obstructive sleep apnea   . Peripheral arterial disease (HCC)    Status post PTCA/stent to right common iliac artery    Patient Active Problem List   Diagnosis Date Noted  . ANXIETY STATE, UNSPECIFIED 12/26/2009  . EDEMA 12/26/2009  . SHORTNESS OF BREATH 12/26/2009  . TOBACCO ABUSE 04/10/2009  . PAROXYSMAL SUPRAVENTRICULAR TACHYCARDIA 04/03/2009  . PERIPHERAL VASCULAR DISEASE 04/03/2009  . LOW BLOOD PRESSURE 04/03/2009  . OBESITY-MORBID (>100') 03/20/2009    Past Surgical History:  Procedure Laterality Date  . BACK SURGERY    . CARDIAC ELECTROPHYSIOLOGY MAPPING AND ABLATION    . CORONARY ANGIOPLASTY WITH STENT PLACEMENT    . Kidney stone removal         Home Medications    Prior to Admission medications   Medication Sig Start Date End Date Taking?  Authorizing Provider  albuterol (ACCUNEB) 0.63 MG/3ML nebulizer solution Take 1 ampule by nebulization every 6 (six) hours as needed for wheezing.    [provider]  albuterol (PROVENTIL HFA;VENTOLIN HFA) 108 (90 Base) MCG/ACT inhaler Inhale into the lungs every 6 (six) hours as needed for wheezing or shortness of breath.    [provider]  citalopram (CELEXA) 20 MG tablet TAKE 1 TABLET BY MOUTH EVERY MORNING 02/17/11   de Melanie Crazier, MD  furosemide (LASIX) 40 MG tablet Take 40 mg by mouth.    [provider]  gabapentin (NEURONTIN) 300 MG capsule Take 300 mg by mouth 3 (three) times daily.    [provider]  LORazepam (ATIVAN) 1 MG tablet Take 1 mg by mouth every 8 (eight) hours.    [provider]  metoprolol succinate (TOPROL-XL) 50 MG 24 hr tablet Take 50 mg by mouth daily. Take with or immediately following a meal.    [provider]  potassium chloride (K-DUR) 10 MEQ tablet Take 10 mEq by mouth daily.    [provider]  ranitidine (ZANTAC) 300 MG capsule Take 300 mg by mouth every evening.    [provider]    Family History Family History  Problem Relation Age of Onset  . Lung cancer Mother   . Stomach cancer Father   . Brain cancer Brother   .  Stroke Brother     Social History Social History  Substance Use Topics  . Smoking status: Current Every Day Smoker    Types: Cigarettes  . Smokeless tobacco: Never Used  . Alcohol use Yes     Comment: Occasional     Allergies   Patient has no known allergies.   Review of Systems Review of Systems  Constitutional: Negative for chills and fever.  HENT: Negative for congestion.   Respiratory: Positive for shortness of breath. Negative for wheezing.        Orthopnea  Cardiovascular: Positive for leg swelling. Negative for chest pain and palpitations.  Gastrointestinal: Negative for abdominal pain.  Endocrine: Negative for polyuria.  Genitourinary:  Negative for flank pain.  Musculoskeletal: Positive for back pain and neck pain.  Neurological: Negative for tremors and headaches.  Hematological: Negative for adenopathy.  Psychiatric/Behavioral: Negative for confusion.     Physical Exam Updated Vital Signs BP (!) 147/71 (BP Location: Right Arm)   Pulse 74   Temp 98.2 F (36.8 C) (Oral)   Resp (!) 24   Ht 6' (1.829 m)   Wt (!) 147.4 kg (325 lb)   SpO2 96%   BMI 44.08 kg/m   Physical Exam  Constitutional: He appears well-developed.  HENT:  Head: Normocephalic.  Eyes: Pupils are equal, round, and reactive to light.  Neck: Neck supple.  Cardiovascular: Normal rate.   Irregular rhythm  Pulmonary/Chest: He has rales.  Rales bilateral bases.   Abdominal: There is no tenderness.  Genitourinary:  Genitourinary Comments: Unable to visualize the penis due to the swelling in the area.  Musculoskeletal: He exhibits edema.  Severe edema to bilateral lower extremities.  Skin: Skin is warm. Capillary refill takes less than 2 seconds.     ED Treatments / Results  Labs (all labs ordered are listed, but only abnormal results are displayed) Labs Reviewed  BASIC METABOLIC PANEL - Abnormal; Notable for the following:       Result Value   Potassium 2.6 (*)    Chloride 96 (*)    Glucose, Bld 135 (*)    All other components within normal limits  CBC - Abnormal; Notable for the following:    RDW 16.0 (*)    All other components within normal limits  I-STAT TROPONIN, ED - Abnormal; Notable for the following:    Troponin i, poc 0.09 (*)    All other components within normal limits  BRAIN NATRIURETIC PEPTIDE    EKG  EKG Interpretation  Date/Time:  Wednesday September 07 2017 15:20:49 EDT Ventricular Rate:  69 PR Interval:    QRS Duration: 107 QT Interval:  319 QTC Calculation: 342 R Axis:   32 Text Interpretation:  Atrial flutter Repol abnrm, severe global ischemia (LM/MVD) Confirmed by Benjiman Core [phone]) on 09/07/2017  3:40:37 PM       Radiology Dg Chest Portable 1 View  Result Date: 09/07/2017 CLINICAL DATA:  Fluid retention, shortness of breath, and irregular heartbeat. Current smoker. History of COPD, cardiac ablation, morbid obesity. EXAM: PORTABLE CHEST 1 VIEW COMPARISON:  Chest x-ray of May 23, 2017 FINDINGS: The cardiopericardial silhouette remains enlarged. The pulmonary vascularity is engorged and there is mild cephalization. The interstitial markings are both lungs are mildly increased. There is no pleural effusion. There is calcification in the wall of the aortic arch. The observed bony thorax is unremarkable. IMPRESSION: CHF with mild interstitial edema.  No alveolar pneumonia. Thoracic aortic atherosclerosis. Electronically Signed   By: David  [place] M.D.  On: 09/07/2017 15:53    Procedures Procedures (including critical care time)  Medications Ordered in ED Medications - No data to display   Initial Impression / Assessment and Plan / ED Course  I have reviewed the triage vital signs and the nursing notes.  Pertinent labs & imaging results that were available during my care of the patient were reviewed by me and considered in my medical decision making (see chart for details).     Patient with shortness of breath likely CHF.unable to lay flat. Sent by cardiology for admission. Does have hypokalemia and new onset atrial flutter. Chadsvasc core of at least 3 for age CHF and vascular disease history.will allow primary and cardiology to decide on anticoagulation. Will needIV diuresis and/or further recommendations in the cardiology. Admit to internal medicine.  Final Clinical Impressions(s) / ED Diagnoses   Final diagnoses:  Congestive heart failure, unspecified HF chronicity, unspecified heart failure type (HCC)  Hypokalemia  Atrial flutter, unspecified type [facility])    New Prescriptions New Prescriptions   No medications on file     Benjiman Core, MD 09/07/17 1637

## 2017-09-07 NOTE — ED Notes (Signed)
I stat trop 0.09  Dr Demetrius Charity notified

## 2017-09-07 NOTE — ED Notes (Signed)
CRITICAL VALUE ALERT  Critical Value:  Potassium 2.6  Date & Time Notied: 09/07/17  1608  Provider Notified: Dr. Rubin Payor  Orders Received/Actions taken:

## 2017-09-07 NOTE — ED Notes (Signed)
Sent to ED from cardiologist  Edema from toes to scrotum   Dr Rubin Payor at bedside

## 2017-09-07 NOTE — Progress Notes (Addendum)
CARDIOLOGY CONSULT NOTE  Patient ID: Edward Andrews MRN: [id number] DOB/AGE: 02-15-1951 66 y.o.  Admit date: (Not on file) Primary Physician: Toma Deiters, MD Referring Physician: Dr. Channing Mutters  Reason for Consultation: Preoperative risk stratification  HPI: Edward Andrews is a 66 y.o. male who is being seen today for the evaluation of preoperative risk stratification at the request of Trey Sailors, MD.   I have no recent records from his PCP at this time.  It appears he saw Dr. Clifton Bernice on 05/27/2009. He reportedly has a history of nonobstructive coronary artery disease, SVT status post ablation, hypertension, hyperlipidemia, COPD, sleep apnea, tobacco abuse, and peripheral vascular disease. He underwent right common iliac artery stenting on 04/30/09. At that time he had proximal SFA stenosis with serial 90% lesions extending 1 cm beyond the ostium.  He underwent an MRI of his cervical spine on 08/11/17 which showed moderate left C7 foraminal stenosis with severe canal and bilateral foraminal stenosis at the C5-C6 level. He had degenerative disc osteophyte at C4-C5 with resultant moderate canal and severe bilateral C5 foraminal stenosis and severe bilateral C4 foraminal stenosis. There was moderate left C3 foraminal stenosis.  His son is with him. His son says the patient has put on 25 pounds in the last 90 days. The patient said he is short of breath but continues to smoke. He denies an increase in exertional dyspnea but his son said he notices that his father is more short of breath. The patient complains of significant scrotal and bilateral leg swelling. He tells me he was hospitalized at [facility] this past summer for cellulitis. He does not recall what testing was done at that time other than some blood tests.  I have spoken to my nursing staff to try and obtain all records from his hospitalization.  Dr. Temple Pacini notes mention that the patient has spastic quadriparesis and needs  decompression and stabilization surgery.  The patient denies chest pain. He gets no physical activity. He said he has bad acid reflux disease for which he takes Zantac and baking soda. He denies paroxysmal nocturnal dyspnea.  He said his scrotum and legs have been swollen for the past 10 or 15 days.  He denies a history of MI.  ECG which I personally interpreted demonstrates atrial flutter with variable conduction, HR 80 bpm.   No Known Allergies  Current Outpatient Prescriptions  Medication Sig Dispense Refill  . citalopram (CELEXA) 20 MG tablet TAKE 1 TABLET BY MOUTH EVERY MORNING 15 tablet 0  . furosemide (LASIX) 40 MG tablet Take 40 mg by mouth.    . gabapentin (NEURONTIN) 300 MG capsule Take 300 mg by mouth 3 (three) times daily.    Marland Kitchen LORazepam (ATIVAN) 1 MG tablet Take 1 mg by mouth every 8 (eight) hours.    . metoprolol succinate (TOPROL-XL) 50 MG 24 hr tablet Take 50 mg by mouth daily. Take with or immediately following a meal.    . potassium chloride (K-DUR) 10 MEQ tablet Take 10 mEq by mouth daily.    . ranitidine (ZANTAC) 300 MG capsule Take 300 mg by mouth every evening.    Marland Kitchen albuterol (ACCUNEB) 0.63 MG/3ML nebulizer solution Take 1 ampule by nebulization every 6 (six) hours as needed for wheezing.    Marland Kitchen albuterol (PROVENTIL HFA;VENTOLIN HFA) 108 (90 Base) MCG/ACT inhaler Inhale into the lungs every 6 (six) hours as needed for wheezing or shortness of breath.     No current facility-administered  medications for this visit.     Past Medical History:  Diagnosis Date  . Anxiety and depression   . AVNRT (AV nodal re-entry tachycardia) (HCC)    Status post ablation 2010  . COPD (chronic obstructive pulmonary disease) (HCC)   . GERD (gastroesophageal reflux disease)   . History of nephrolithiasis   . Mixed hyperlipidemia   . Obstructive sleep apnea   . Peripheral arterial disease (HCC)    Status post PTCA/stent to right common iliac artery    Past Surgical History:    Procedure Laterality Date  . BACK SURGERY    . Kidney stone removal      Social History   Social History  . Marital status: Married    Spouse name: N/A  . Number of children: N/A  . Years of education: N/A   Occupational History  . Not on file.   Social History Main Topics  . Smoking status: Current Every Day Smoker    Types: Cigarettes  . Smokeless tobacco: Never Used  . Alcohol use Yes     Comment: Occasional  . Drug use: No  . Sexual activity: Not on file   Other Topics Concern  . Not on file   Social History Narrative  . No narrative on file     No family history of premature CAD in 1st degree relatives.  Current Meds  Medication Sig  . citalopram (CELEXA) 20 MG tablet TAKE 1 TABLET BY MOUTH EVERY MORNING  . furosemide (LASIX) 40 MG tablet Take 40 mg by mouth.  . gabapentin (NEURONTIN) 300 MG capsule Take 300 mg by mouth 3 (three) times daily.  Marland Kitchen LORazepam (ATIVAN) 1 MG tablet Take 1 mg by mouth every 8 (eight) hours.  . metoprolol succinate (TOPROL-XL) 50 MG 24 hr tablet Take 50 mg by mouth daily. Take with or immediately following a meal.  . potassium chloride (K-DUR) 10 MEQ tablet Take 10 mEq by mouth daily.  . ranitidine (ZANTAC) 300 MG capsule Take 300 mg by mouth every evening.      Review of systems complete and found to be negative unless listed above in HPI    Physical exam Blood pressure (!) 150/104, pulse 64, height 6' (1.829 m), weight (!) 319 lb (144.7 kg), SpO2 96 %. General: NAD Neck: No JVD, no thyromegaly or thyroid nodule.  Lungs: Clear to auscultation bilaterally with normal respiratory effort. CV: Nondisplaced PMI. Regular rate and rhythm, normal S1/S2, no S3/S4, no murmur.   Tense 3+ bilateral pitting pretibial edema extending above the knees. Abdomen: Firm, obese.  Skin: Intact without lesions or rashes.  Neurologic: Alert and oriented x 3.  Psych: Normal affect. Extremities: No clubbing or cyanosis.  HEENT: Normal.   ECG:  Most recent ECG reviewed.   Labs: Lab Results  Component Value Date/Time   K 3.8 05/01/2009 04:05 AM   BUN 19 05/01/2009 04:05 AM   CREATININE 0.86 05/01/2009 04:05 AM   HGB 14.3 05/01/2009 04:05 AM     Lipids: No results found for: LDLCALC, LDLDIRECT, CHOL, TRIG, HDL      ASSESSMENT AND PLAN:  1. Bilateral lower extremity edema with increasing shortness of breath and scrotal swelling: He appears to be in acute CHF. I am not certain whether he has systolic and/or diastolic dysfunction. He will require an echocardiogram with contrast enhancement. He will need intravenous diuresis. I spoke at length with the patient and his son. The patient was initially very reluctant but he has been convinced that  he requires hospitalization.  2. Preoperative risk stratification: He is not near the point of being able to undergo cervical spine decompression and stabilization surgery.  As I am unable to accurately assess his exercise tolerance, he will eventually require a Lexiscan Myoview stress test but this can be pursued on an outpatient basis after he is euvolemic from a congestive heart failure standpoint. He appears to be on metoprolol succinate which I would continue. He needs more optimal blood pressure control as it is significant elevated. This may improve with IV diuresis.  3. Peripheral vascular disease: He should be on both aspirin and statin therapy and he is on neither.  4. New onset atrial flutter: He will require anticoagulation as CHADSVASC score is 4.  5. Hypertension: Markedly elevated. This needs to be better controlled. He is only on metoprolol succinate. IV diuresis will help lower blood pressure but I am certainly he will require additional antihypertensive therapy.  6. Tobacco abuse: Cessation counseling provided (3 minutes).   Disposition: I have urged him to go to [facility] for intravenous diuresis and further medical optimization. Follow up with me after  hospitalization.    Signed: Prentice Docker, M.D., F.A.C.C.  09/07/2017, 11:20 AM

## 2017-09-07 NOTE — ED Triage Notes (Signed)
Pt reports swelling in groin and legs for the past 6-8 weeks.  REports sob and reports irregular heart beat.  Pt sent by Dr. Darl Householder.

## 2017-09-07 NOTE — Progress Notes (Signed)
ANTICOAGULATION CONSULT NOTE - Initial Consult  Pharmacy Consult for Lovenox Indication: atrial fibrillation (new)  No Known Allergies  Patient Measurements: Height: 6' (182.9 cm) Weight: (!) 322 lb 6.4 oz (146.2 kg) IBW/kg (Calculated) : 77.6 Heparin Dosing Weight:   Vital Signs: Temp: 98.7 F (37.1 C) (10/10 1900) Temp Source: Oral (10/10 1900) BP: 153/112 (10/10 1900) Pulse Rate: 129 (10/10 1900)  Labs:  Recent Labs  09/07/17 1527  HGB 13.7  HCT 42.4  PLT 181  CREATININE 0.97   Estimated Creatinine Clearance: 109.8 mL/min (by C-G formula based on SCr of 0.97 mg/dL).  Medical History: Past Medical History:  Diagnosis Date  . Anxiety and depression   . AVNRT (AV nodal re-entry tachycardia) (HCC)    Status post ablation 2010  . COPD (chronic obstructive pulmonary disease) (HCC)   . GERD (gastroesophageal reflux disease)   . History of nephrolithiasis   . Mixed hyperlipidemia   . Obstructive sleep apnea    does not wear CPAP  . Panic disorder   . Peripheral arterial disease (HCC)    Status post PTCA/stent to right common iliac artery   Medications:  Prescriptions Prior to Admission  Medication Sig Dispense Refill Last Dose  . albuterol (ACCUNEB) 0.63 MG/3ML nebulizer solution Take 1 ampule by nebulization every 6 (six) hours as needed for wheezing.   09/06/2017 at Unknown time  . albuterol (PROVENTIL HFA;VENTOLIN HFA) 108 (90 Base) MCG/ACT inhaler Inhale into the lungs every 6 (six) hours as needed for wheezing or shortness of breath.   09/07/2017 at Unknown time  . allopurinol (ZYLOPRIM) 300 MG tablet Take 300 mg by mouth daily as needed (for joint pain).    unknown  . citalopram (CELEXA) 20 MG tablet TAKE 1 TABLET BY MOUTH EVERY MORNING 15 tablet 0 09/07/2017 at Unknown time  . furosemide (LASIX) 40 MG tablet Take 40 mg by mouth daily.    09/07/2017 at Unknown time  . gabapentin (NEURONTIN) 300 MG capsule Take 300 mg by mouth 3 (three) times daily.   09/07/2017  at Unknown time  . LORazepam (ATIVAN) 1 MG tablet Take 1 mg by mouth every 8 (eight) hours.   09/07/2017 at Unknown time  . metoprolol tartrate (LOPRESSOR) 50 MG tablet Take 50 mg by mouth 2 (two) times daily.   09/07/2017 at 900a  . nystatin (MYCOSTATIN/NYSTOP) powder Apply topically daily as needed (for rash).    Past Week at Unknown time  . potassium chloride (K-DUR) 10 MEQ tablet Take 10 mEq by mouth daily.   09/07/2017 at Unknown time  . ranitidine (ZANTAC) 300 MG capsule Take 300 mg by mouth every evening.   09/06/2017 at Unknown time  . umeclidinium bromide (INCRUSE ELLIPTA) 62.5 MCG/INH AEPB Inhale 1 puff into the lungs daily.   09/07/2017 at Unknown time   Assessment: Okay for Protocol, no bleeding noted.  Baseline PT/INR pending.  Goal of Therapy:  Systemic Anticoagulation.  Anti-Xa level 0.6-1 units/ml 4hrs after LMWH dose given if clinically indicated. Monitor platelets by anticoagulation protocol: Yes   Plan:  Lovenox /kg SQ every 12 hours. CBC every 48 hours. Monitor for signs and symptoms of bleeding.   Lamonte Richer R 09/07/2017,7:25 PM

## 2017-09-07 NOTE — H&P (Addendum)
History and Physical    Edward Andrews:[id number] DOB: Apr 22, 1951 DOA: 09/07/2017  PCP: Toma Deiters, MD Consultants:  Purvis Sheffield - cardiology; Channing Mutters - orthopedics Patient coming from: Home - lives with wife and grandson; [place]: wife, [phone]  Chief Complaint: edema  HPI: Edward Andrews is a 66 y.o. male with medical history significant of PAD; anxiety/depression/panic; OSA not on CPAP; HLD; COPD; and h/o AVNRT s/p ablation in 2010 presenting from Dr. Junius Argyle office with apparent CHF exacerbation.  He complains of swelling, fluid, numbness, weakness.  Can't breathe, SOB.  Constipated.  Symptoms have been going on for 6-8 weeks.  Symptoms started right after his last PCP visit.  Today, he went to the cardiologist and he sent him to [facility] for admission.  SOB for the last week all the time, previously it was intermittent but not necessarily exertional.  Unable to walk around due to back pain.  Previously slept on 2 pillows but now sleeping in a recliner.  No PND.  No chest pain but he has had jaw pain, comes and goes, maybe 6 total times but very transient; he associated it with his neck.  Constipation is chronic, he is taking pain medication.  Linzess makes him have fecal urgency and he can't get to the bathroom soon enough so he doesn't take it much.  He was supposed to have back surgery on Oct 19 and he was sent to cardiology for cardiac clearance.  Dr. Junius Argyle note reports concern for acute CHF - uncertain systolic vs. Diastolic.  Suggests Echo, IV Lasix.  Likely to eventually need a stress test for clearance prior to his surgery, possibly done as an outpatient.  He needs an ASA and statin.     ED Course:  Hypokalemia, new a flutter, likely to need AC, IV diuresis.    Review of Systems: As per HPI; otherwise review of systems reviewed and negative.   Ambulatory Status:  Ambulates with a walker  Past Medical History:  Diagnosis Date  . Anxiety and depression   . AVNRT (AV nodal  re-entry tachycardia) (HCC)    Status post ablation 2010  . COPD (chronic obstructive pulmonary disease) (HCC)   . GERD (gastroesophageal reflux disease)   . History of nephrolithiasis   . Mixed hyperlipidemia   . Obstructive sleep apnea    does not wear CPAP  . Panic disorder   . Peripheral arterial disease (HCC)    Status post PTCA/stent to right common iliac artery    Past Surgical History:  Procedure Laterality Date  . BACK SURGERY    . CARDIAC ELECTROPHYSIOLOGY MAPPING AND ABLATION    . CORONARY ANGIOPLASTY WITH STENT PLACEMENT    . Kidney stone removal      Social History   Social History  . Marital status: Married    Spouse name: N/A  . Number of children: N/A  . Years of education: N/A   Occupational History  . retired    Social History Main Topics  . Smoking status: Current Every Day Smoker    Packs/day: 1.00    Years: 43.00    Types: Cigarettes  . Smokeless tobacco: Never Used  . Alcohol use No  . Drug use: No  . Sexual activity: Not on file   Other Topics Concern  . Not on file   Social History Narrative  . No narrative on file    No Known Allergies  Family History  Problem Relation Age of Onset  . Lung cancer Mother   .  Stomach cancer Father   . Brain cancer Brother   . Stroke Brother     Prior to Admission medications   Medication Sig Start Date End Date Taking? Authorizing Provider  albuterol (ACCUNEB) 0.63 MG/3ML nebulizer solution Take 1 ampule by nebulization every 6 (six) hours as needed for wheezing.    [provider]  albuterol (PROVENTIL HFA;VENTOLIN HFA) 108 (90 Base) MCG/ACT inhaler Inhale into the lungs every 6 (six) hours as needed for wheezing or shortness of breath.    [provider]  citalopram (CELEXA) 20 MG tablet TAKE 1 TABLET BY MOUTH EVERY MORNING 02/17/11   de Melanie Crazier, MD  furosemide (LASIX) 40 MG tablet Take 40 mg by mouth.    [provider]  gabapentin (NEURONTIN) 300 MG capsule Take  300 mg by mouth 3 (three) times daily.    [provider]  LORazepam (ATIVAN) 1 MG tablet Take 1 mg by mouth every 8 (eight) hours.    [provider]  metoprolol succinate (TOPROL-XL) 50 MG 24 hr tablet Take 50 mg by mouth daily. Take with or immediately following a meal.    [provider]  potassium chloride (K-DUR) 10 MEQ tablet Take 10 mEq by mouth daily.    [provider]  ranitidine (ZANTAC) 300 MG capsule Take 300 mg by mouth every evening.    [provider]    Physical Exam: Vitals:   09/07/17 1830 09/07/17 1900 09/07/17 1926 09/07/17 1928  BP: (!) 146/82 (!) 153/112 (!) 120/101   Pulse: 89 (!) 129 (!) 102   Resp: 16 20    Temp:  98.7 F (37.1 C)    TempSrc:  Oral    SpO2: 94% 97%  94%  Weight:  (!) 146.2 kg (322 lb 6.4 oz)    Height:  6' (1.829 m)       General:  Appears calm and comfortable and is NAD; morbidly obese Eyes:  PERRL, EOMI, normal lids, iris ENT:  grossly normal hearing, lips & tongue, mmm; poor dentition Neck:  no LAD, masses or thyromegaly; no carotid bruits Cardiovascular:  Irregularly irregular but rate controlled, no m/r/g. Respiratory:   CTA bilaterally with no wheezes/rales/rhonchi.  Normal respiratory effort. Abdomen:  full, NT, ND, NABS Back:   normal alignment, no CVAT Skin: evidence of LE venous stasis; he has 2 small superficial ulcerations on his right foot Musculoskeletal:  grossly normal tone BUE/BLE, good ROM, no bony abnormality; marked B LE edema extending all the way into the groin Psychiatric:  grossly normal mood and affect, speech fluent and appropriate, AOx3 Neurologic:  CN 2-12 grossly intact, moves all extremities in coordinated fashion, sensation intact    Radiological Exams on Admission: Dg Chest Portable 1 View  Result Date: 09/07/2017 CLINICAL DATA:  Fluid retention, shortness of breath, and irregular heartbeat. Current smoker. History of COPD, cardiac ablation, morbid obesity.  EXAM: PORTABLE CHEST 1 VIEW COMPARISON:  Chest x-ray of May 23, 2017 FINDINGS: The cardiopericardial silhouette remains enlarged. The pulmonary vascularity is engorged and there is mild cephalization. The interstitial markings are both lungs are mildly increased. There is no pleural effusion. There is calcification in the wall of the aortic arch. The observed bony thorax is unremarkable. IMPRESSION: CHF with mild interstitial edema.  No alveolar pneumonia. Thoracic aortic atherosclerosis. Electronically Signed   By: David  [place] M.D.   On: 09/07/2017 15:53    EKG: Independently reviewed.  Aflutter with rate 69; nonspecific ST changes with concern for severe  global ischemia without acute infarct  Labs on Admission: I have personally reviewed the available labs and imaging studies at the time of the admission.  Pertinent labs:   K+ 2.6 Glucose 135 BNP 747 Troponin 0.09 x 2 INR 1.11 TSH 1.345 WBC 7.9   Assessment/Plan Principal Problem:   CHF exacerbation (HCC) Active Problems:   OBESITY-MORBID (>100')   TOBACCO ABUSE   Atrial flutter with controlled response (HCC)   Hypokalemia   Back pain   Neck pain   CHF exacerbation with apparently new onset aflutter -Morbidly obese patient presenting with anasarca  -CXR with mild pulmonary edema -Normal WBC count, no fever; will not give antibiotics at this time -Elevated BNP -Initial EKG with new-onset atrial flutter -He has a h/o AVNRT s/p ablation with no further arrhythmia since -Review of the EKG in Dr. Junius Argyle office also appears to show aflutter -Rate controlled  -With elevated BNP and abnl CXR, new-onset CHF seems most probable as diagnosis -Will admit with telemetry -Will request echocardiogram -Will start ASA -Will start Lisinopril 5 mg daily (BP is normal to high and so should support addition of medication) -Continue home beta blocker -CHF order set utilized; may need CHF team consult but will hold until Echo results  are available -Cardiology consult in AM -Was given Lasix 40 mg x 1 in ER and will repeat with 40 mg IV BID -Continue [place] O2 for now -Normal kidney function at this time, will follow -Repeat EKG in AM -If new-onset afib/flutter, he is currently rate controlled.  CHA2DS2-VASc Score is 4 (CHF, HTN, vascular disease, male, age 6-74) and so he would be recommended to start anti-coagulation.  Will start treatment-dose Lovenox for now, defer PO AC decision to day team in AM. -Will r/o with serial troponins although doubt ACS based on symptoms - suspect demand ischemia -Based on the amount of edema present, it is likely to take a number of days to effectively diurese him.  Obesity -He would really benefit from weight loss -Consider bariatric surgery, if possible -Has reported h/o OSA, not on CPAP.  Consider repeat sleep study as an outpatient. -Nutrition consult  Tobacco Dependence -Encourage cessation.  This was discussed with the patient and should be reviewed on an ongoing basis.  -Patch ordered.  Hypokalemia -Patient unable to tolerate IV KCL replacement -Will change to 40 mEq PO x 2 -Continue home KCl PO -Recheck BMP in AM  Back/neck pain -Patient was supposed to be having pre-operative clearance for neck surgery -This will need to be delayed as his acute medical problems are cared for  DVT prophylaxis: Treatment-dose Lovenox Code Status:  Full - confirmed with patient/family Family Communication: Wife, son present throughout evaluation  Disposition Plan:  Home once clinically improved Consults called: Cardiology; CM; nutrition  Admission status: Admit - It is my clinical opinion that admission to INPATIENT is reasonable and necessary because this patient will require at least 2 midnights in the hospital to treat this condition based on the medical complexity of the problems presented.  Given the aforementioned information, the predictability of an adverse outcome is felt to be  significant.    Edward Blue MD [facility]  If note is complete, please contact covering daytime or nighttime physician. [email or web address] Password [email or web address]  09/07/2017, 10:00 PM

## 2017-09-08 ENCOUNTER — Encounter: Payer: Self-pay | Admitting: *Deleted

## 2017-09-08 ENCOUNTER — Inpatient Hospital Stay (HOSPITAL_COMMUNITY): Payer: Medicare HMO

## 2017-09-08 DIAGNOSIS — I361 Nonrheumatic tricuspid (valve) insufficiency: Secondary | ICD-10-CM

## 2017-09-08 DIAGNOSIS — I4892 Unspecified atrial flutter: Secondary | ICD-10-CM

## 2017-09-08 DIAGNOSIS — I5033 Acute on chronic diastolic (congestive) heart failure: Secondary | ICD-10-CM

## 2017-09-08 DIAGNOSIS — I340 Nonrheumatic mitral (valve) insufficiency: Secondary | ICD-10-CM

## 2017-09-08 LAB — CBC WITH DIFFERENTIAL/PLATELET
Basophils Absolute: 0.1 10*3/uL (ref 0.0–0.1)
Basophils Relative: 1 %
EOS ABS: 0.2 10*3/uL (ref 0.0–0.7)
EOS PCT: 3 %
HEMATOCRIT: 39.5 % (ref 39.0–52.0)
HEMOGLOBIN: 12.6 g/dL — AB (ref 13.0–17.0)
LYMPHS PCT: 32 %
Lymphs Abs: 2.4 10*3/uL (ref 0.7–4.0)
MCH: 31.3 pg (ref 26.0–34.0)
MCHC: 31.9 g/dL (ref 30.0–36.0)
MCV: 98.3 fL (ref 78.0–100.0)
Monocytes Absolute: 1.2 10*3/uL — ABNORMAL HIGH (ref 0.1–1.0)
Monocytes Relative: 15 %
Neutro Abs: 3.8 10*3/uL (ref 1.7–7.7)
Neutrophils Relative %: 49 %
Platelets: 177 10*3/uL (ref 150–400)
RBC: 4.02 MIL/uL — AB (ref 4.22–5.81)
RDW: 16 % — ABNORMAL HIGH (ref 11.5–15.5)
WBC: 7.7 10*3/uL (ref 4.0–10.5)

## 2017-09-08 LAB — ECHOCARDIOGRAM COMPLETE
CHL CUP MV DEC (S): 257
CHL CUP TV REG PEAK VELOCITY: 289 cm/s
E decel time: 257 msec
FS: 31 % (ref 28–44)
HEIGHTINCHES: 72 in
IVS/LV PW RATIO, ED: 1.11
LA diam end sys: 36 mm
LA vol: 95.6 mL
LADIAMINDEX: 1.28 cm/m2
LASIZE: 36 mm
LAVOLA4C: 88 mL
LAVOLIN: 34 mL/m2
LV PW d: 11.3 mm — AB (ref 0.6–1.1)
LVDIAVOL: 117 mL (ref 62–150)
LVDIAVOLIN: 42 mL/m2
LVOT SV: 62 mL
LVOT VTI: 18 cm
LVOT area: 3.46 cm2
LVOT diameter: 21 mm
LVOT peak grad rest: 3 mmHg
LVOTPV: 80.3 cm/s
LVSYSVOL: 51 mL
LVSYSVOLIN: 18 mL/m2
MV Peak grad: 7 mmHg
MV pk A vel: 71.1 m/s
MVPKEVEL: 132 m/s
RV sys press: 41 mmHg
Simpson's disk: 57
Stroke v: 66 ml
TAPSE: 22.4 mm
TR max vel: 289 cm/s
VTI: 176 cm
WEIGHTICAEL: 5214.4 [oz_av]

## 2017-09-08 LAB — BASIC METABOLIC PANEL
Anion gap: 10 (ref 5–15)
BUN: 13 mg/dL (ref 6–20)
CHLORIDE: 97 mmol/L — AB (ref 101–111)
CO2: 31 mmol/L (ref 22–32)
CREATININE: 0.93 mg/dL (ref 0.61–1.24)
Calcium: 8.9 mg/dL (ref 8.9–10.3)
Glucose, Bld: 94 mg/dL (ref 65–99)
Potassium: 3.2 mmol/L — ABNORMAL LOW (ref 3.5–5.1)
SODIUM: 138 mmol/L (ref 135–145)

## 2017-09-08 LAB — TROPONIN I
TROPONIN I: 0.15 ng/mL — AB (ref <0.03)
TROPONIN I: 0.12 ng/mL — AB (ref <0.03)

## 2017-09-08 MED ORDER — FUROSEMIDE 10 MG/ML IJ SOLN
80.0000 mg | Freq: Two times a day (BID) | INTRAMUSCULAR | Status: DC
  Administered 2017-09-08 – 2017-09-11 (×7): 80 mg via INTRAVENOUS
  Filled 2017-09-08 (×8): qty 8

## 2017-09-08 MED ORDER — MAGNESIUM SULFATE 2 GM/50ML IV SOLN
2.0000 g | Freq: Once | INTRAVENOUS | Status: AC
  Administered 2017-09-08: 2 g via INTRAVENOUS
  Filled 2017-09-08: qty 50

## 2017-09-08 MED ORDER — LIVING BETTER WITH HEART FAILURE BOOK
Freq: Once | Status: AC
Start: 2017-09-08 — End: 2017-09-09
  Administered 2017-09-09: 10:00:00

## 2017-09-08 MED ORDER — APIXABAN 5 MG PO TABS
5.0000 mg | ORAL_TABLET | Freq: Two times a day (BID) | ORAL | Status: DC
  Administered 2017-09-08 – 2017-09-17 (×19): 5 mg via ORAL
  Filled 2017-09-08 (×13): qty 1
  Filled 2017-09-08: qty 2
  Filled 2017-09-08 (×5): qty 1

## 2017-09-08 MED ORDER — POTASSIUM CHLORIDE CRYS ER 20 MEQ PO TBCR
40.0000 meq | EXTENDED_RELEASE_TABLET | Freq: Two times a day (BID) | ORAL | Status: DC
  Administered 2017-09-08 – 2017-09-16 (×18): 40 meq via ORAL
  Filled 2017-09-08 (×4): qty 2
  Filled 2017-09-08 (×2): qty 4
  Filled 2017-09-08 (×12): qty 2

## 2017-09-08 NOTE — Progress Notes (Signed)
RN notified by telemetry monitoring that pt had a couple of pauses and pt rhythm was had changed from a-fib to flutter. Attending MD notified. MD asked RN to relay info to cardiologist. Dr. Eden Emms made aware and he stated that pt had been in a-fib and flutter this entire admission. No new orders. Will continue to monitor.

## 2017-09-08 NOTE — Plan of Care (Signed)
Problem: Food- and Nutrition-Related Knowledge Deficit (NB-1.1) Goal: Nutrition education Formal process to instruct or train a patient/client in a skill or to impart knowledge to help patients/clients voluntarily manage or modify food choices and eating behavior to maintain or improve health. Outcome: Adequate for Discharge  RD consulted for nutrition education regarding weight loss, however, during discussion with family, it became evident that patients HF is more acute issue at this time.They note that patient has been noncompliant with his diuretic and low sodium recommended diet. Discussed aspects of both diet, though there is certainly natural overlap between the two  Patient is deep asleep on RD arrival. The wife and the son are at bedside. They report they have already talking to many other health team members regarding need for wt loss.   Diet recall (provided by wife): Breakfast: Either skips, or wife fixes 2 ham/egg/cheese biscuits. They note that when they eat breakfast at restaurant, they patient eats very large portions of sausage, bacon, gravy and other items he knows he should avoid Lunch: Skips Dinner: Skips Late night "meal": Patient goes from breakfast to 9-10 pm without eating. At this time he typically will have a sandwich, baked chicken w/ vegetable, homemade soup Beverages: He drinks 1 soda/day and wife estimates 5 glasses of water/day. Occasionally has sweet tea.  Other: Snacks on high sodium/sugar items throughout day such as pastries/candy/chips etc. Rarely eats out  First and foremost, RD emphasized the importance of eating at least 3x a day and not skipping meals. He sounds to be fasting for upwards of 12 hrs/day. He should be eating something midday.   RD asked about eating out. They state that this is rarely. RD said that everything once in a while is ok, RD is more interested changing his habits. It is at this time the son points to a very large honey bun on the  patients bedside shelf and states "there is your habits right there". Apparently, the wife had neglected to say that the patient has a very poor habit of snacking on items such as these. They state that they all know this is not good, but "it is one thing to know what to eat and another thing to do it"  Son stated that much of patients recent wt gain has been related to swelling. They point out massive edema on patient. RD asked about high sodium items. The wife denies pt consuming canned meats, frozen meals or condensed soup w/ any frequency. She does state that the patient eats ham/cheese sandwiches often and also enjoys hotdogs. RD stated the deli meats and cheese are extremely high in sodium. Recommended cooking fresh meats and slicing for sandwiches. RD advocated for hamburgers made from ground beef over his hotdogs as the hamburgers, if made fresh, will be salt free.   They say they have been told to eat whole grain bread instead of white. RD agreed that this would be beneficial in terms of his weight, however, RD feels there are much larger diet issues than this.   Patient seems to have tendency to over consume portions and snacks. RD stated that potentially they should think about not buying the unhealthy items or taking the patient to the buffets, as these are obviously negatively affecting his health.   They note patients noncompliance throughout the conversation. They say he has not been taking his fluid pill as he has been directed. He does not like the frequency it makes him urinate. RD recommended using this as motivation for  reducing the sodium in his diet. If he reduces his sodium/fluid intake, he may need a smaller dose of the diuretic.   RD provided handouts listing high sodium foods and alternatives, titled "Heart Failure Nutrition Therapy", as well as a handout on weight loss, titled "Weight loss tips".   RD hand wrote following recommendations .  Summary of Recommendations 1. >/=  Eat 3 x a day- dont go >12 hrs w/o eating 2. Avoid buffets and eating out where easy to over consume kcals/sodium 3. Limit hotdogs to </=2/week 4. Avoid Packaged Deli meats. Use fresh meats for sandwiches 5. Try to eliminate habit of snacking on pastries and other processed snack items 6. Limit sodium to <2000 mg/day 7. Limit fluids to <2 L/day  Body mass index is 44.2 kg/m. Pt meets criteria for Morbidly obese based on current BMI, however he has SIGNIFICANT edema  Expect Poor-Fair compliance- Despite family being knowledgeable and sounding to try tried hard to get patient to comply with meds/diet, have been largely unsuccessful. Sounds that patient is not motivated to make changes. They state "it is one thing to know what to eat and another thing to do it"  Current diet order is Heart Healthy, patient is consuming approximately 50-75% of meals at this time. Labs and medications reviewed. No further nutrition interventions warranted at this time. f additional nutrition issues arise, please re-consult RD.  Christophe Louis RD, LDN, CNSC Clinical Nutrition Pager: [phone] 09/08/2017 5:33 PM

## 2017-09-08 NOTE — Evaluation (Signed)
Occupational Therapy Evaluation Patient Details Name: Edward Andrews MRN: [id number] Andrews 17, 1952 Today's Date: 09/08/2017    History of Present Illness OSA CAMPOLI is a 66 y.o. male with medical history significant of PAD; anxiety/depression/panic; OSA not on CPAP; HLD; COPD; and h/o AVNRT s/p ablation in 2010 presenting from Dr. Junius Argyle office with apparent CHF exacerbation.  He complains of swelling, fluid, numbness, weakness.  Can't breathe, SOB.  Constipated.  Symptoms have been going on for 6-8 weeks.  Symptoms started right after his last PCP visit.  Today, he went to the cardiologist and he sent him to [facility] for admission.  SOB for the last week all the time, previously it was intermittent but not necessarily exertional.  Unable to walk around due to back pain.  Previously slept on 2 pillows but now sleeping in a recliner.  No PND.  No chest pain but he has had jaw pain, comes and goes, maybe 6 total times but very transient; he associated it with his neck.  Constipation is chronic, he is taking pain medication.  Linzess makes him have fecal urgency and he can't get to the bathroom soon enough so he doesn't take it much.  He was supposed to have back surgery on Oct 19 and he was sent to cardiology for cardiac clearance.   Clinical Impression   Pt received seated at EOB, agreeable to OT evaluation. Pt with deficits related to functional task completion due to edema limiting mobility and chronic back pain. At this time pt requiring set-up for all tasks, completing ADLs in sitting due to pain and balance deficits in standing. At home pt's wife provides set-up. Pt is limited in mobility, uses RW in the home and wheelchair for all community mobility, however pt rarely leaves home. At this time pt is requiring increased assistance due to edema and SOB, is at his baseline with ADLs otherwise. No further acute OT services required at this time.      Follow Up Recommendations  No OT follow  up;Supervision/Assistance - 24 hour    Equipment Recommendations  None recommended by OT       Precautions / Restrictions Precautions Precautions: Fall Precaution Comments: right knee buckling Restrictions Weight Bearing Restrictions: No      Mobility Bed Mobility               General bed mobility comments: Pt sitting at EOB on OT arrival  Transfers Overall transfer level: Needs assistance Equipment used: Rolling walker (2 wheeled) Transfers: Sit to/from [employer] Transfers Sit to Stand: Min guard;From elevated surface Stand pivot transfers: Min guard       General transfer comment: Pt requiring increased time for transfer tasks. RLE buckling experienced-pt able to correct        ADL either performed or assessed with clinical judgement   ADL Overall ADL's : Needs assistance/impaired Eating/Feeding: Modified independent;Sitting   Grooming: Set up;Sitting   Upper Body Bathing: Set up;Sitting Upper Body Bathing Details (indicate cue type and reason): Pt sponge bathes and wife provides set-up for task Lower Body Bathing: Moderate assistance;Sitting/lateral leans Lower Body Bathing Details (indicate cue type and reason): Pt sponges bathes, wife sets up  Upper Body Dressing : Modified independent;Sitting   Lower Body Dressing: Maximal assistance;Sitting/lateral leans Lower Body Dressing Details (indicate cue type and reason): Pt unable to reach feet  Toilet Transfer: Min guard;Stand-pivot;RW   Toileting- Clothing Manipulation and Hygiene: Modified independent;Sitting/lateral lean  Vision Baseline Vision/History: No visual deficits Patient Visual Report: No change from baseline Vision Assessment?: No apparent visual deficits            Pertinent Vitals/Pain Pain Assessment: No/denies pain     Hand Dominance Right   Extremity/Trunk Assessment Upper Extremity Assessment Upper Extremity Assessment: Overall WFL for tasks  assessed   Lower Extremity Assessment Lower Extremity Assessment: Defer to PT evaluation   Cervical / Trunk Assessment Cervical / Trunk Assessment: Normal   Communication Communication Communication: No difficulties   Cognition Arousal/Alertness: Awake/alert Behavior During Therapy: WFL for tasks assessed/performed Overall Cognitive Status: Within Functional Limits for tasks assessed                                                Home Living Family/patient expects to be discharged to:: Private residence Living Arrangements: Spouse/significant other;Other relatives (grandson) Available Help at Discharge: Family;Available 24 hours/day Type of Home: House Home Access: Ramped entrance     Home Layout: One level     Bathroom Shower/Tub: Chief Strategy Officer: Handicapped height     Home Equipment: Environmental consultant - 2 wheels;Cane - single point;Wheelchair - manual          Prior Functioning/Environment Level of Independence: Independent with assistive device(s);Needs assistance  Gait / Transfers Assistance Needed: Uses walker in house and to front porch; uses wheelchair for community  ADL's / Homemaking Assistance Needed: Pt independent in dressing, set up for sponge-bathing. Wife fixes meals            OT Problem List: Decreased activity tolerance;Impaired balance (sitting and/or standing);Cardiopulmonary status limiting activity       End of Session Equipment Utilized During Treatment: Gait belt;Rolling walker  Activity Tolerance: Patient tolerated treatment well Patient left: in chair;with call bell/phone within reach  OT Visit Diagnosis: Muscle weakness (generalized) (M62.81)                Time: 1610-9604 OT Time Calculation (min): 22 min Charges:  OT General Charges $OT Visit: 1 Visit OT Evaluation $OT Eval Low Complexity: 1 Low   Ezra Sites, OTR/L  [phone] 09/08/2017, 9:12 AM

## 2017-09-08 NOTE — Consult Note (Signed)
Cardiology Consultation:   Patient ID: Edward Andrews; [id number]; 02/06/1951   Admit date: 09/07/2017 Date of Consult: 09/08/2017  Primary Care Provider: Toma Deiters, MD Primary Cardiologist: Prentice Docker, MD    Patient Profile:   Edward Andrews is a 66 y.o. male with a hx of HTN, PVD(Status post right common iliac stenting in 2010),  nonobstructive CAD per catheterization in 2010, also ablation in the setting of SVT, hyperlipidemia and COPD ongoing tobacco abuse, who was admitted from our office yesterday via the ER  who is being seen today for the evaluation of CHF at the request of Dr. Gonzella Lex, Hospitalist Service.   History of Present Illness:   Edward Andrews was seen in our office for the first time as a preoperative evaluation for cervical spine surgery in the setting of spastic quadriparesis, with decompression and stabilization surgical repair. He was found to have decompensated CHF on examination. Due to significant volume overload, complaints of lower extremity edema and scrotal edema, the patient was sent to the emergency room at [facility]. He states that the edema had been coming on over about 6 weeks. He denies medical non-compliance, but admits to some dietary non-discretion.   On arrival to the emergency room the patient's blood pressure was 150/104, heart rate 64, O2 sat 96%, he was afebrile.EKG revealed atrial fib/flutter, HR 69 bpm.  Pertinent labs revealed troponin 0.09, potassium of 2.6, glucose 135, creatinine 0.97. He was not found to be anemic, there was no leukocytosis and thrombocytopenia. Chest x-ray was negative for pneumonia, did reveal CHF with interstitial edema.   He was treated with potassium IV and by mouth. He has been placed on IV Lasix 40 mg every 12 hours, echocardiogram has been ordered for evaluation of systolic function. He is diuresed 200 cc per documentation. Weight on admission 325 pounds. Most recent documented echocardiogram 2009,  demonstrated ejection fraction was 60-65%.  There was mild aortic valve sclerosis.  Past Medical History:  Diagnosis Date  . Anxiety and depression   . AVNRT (AV nodal re-entry tachycardia) (HCC)    Status post ablation 2010  . COPD (chronic obstructive pulmonary disease) (HCC)   . GERD (gastroesophageal reflux disease)   . History of nephrolithiasis   . Mixed hyperlipidemia   . Obstructive sleep apnea    does not wear CPAP  . Panic disorder   . Peripheral arterial disease (HCC)    Status post PTCA/stent to right common iliac artery    Past Surgical History:  Procedure Laterality Date  . BACK SURGERY    . CARDIAC ELECTROPHYSIOLOGY MAPPING AND ABLATION    . CORONARY ANGIOPLASTY WITH STENT PLACEMENT    . Kidney stone removal       Home Medications:  Prior to Admission medications   Medication Sig Start Date End Date Taking? Authorizing Provider  albuterol (ACCUNEB) 0.63 MG/3ML nebulizer solution Take 1 ampule by nebulization every 6 (six) hours as needed for wheezing.   Yes [provider]  albuterol (PROVENTIL HFA;VENTOLIN HFA) 108 (90 Base) MCG/ACT inhaler Inhale into the lungs every 6 (six) hours as needed for wheezing or shortness of breath.   Yes [provider]  allopurinol (ZYLOPRIM) 300 MG tablet Take 300 mg by mouth daily as needed (for joint pain).    Yes [provider]  citalopram (CELEXA) 20 MG tablet TAKE 1 TABLET BY MOUTH EVERY MORNING 02/17/11  Yes de Melanie Crazier, MD  furosemide (LASIX) 40 MG tablet Take 40 mg  by mouth daily.    Yes [provider]  gabapentin (NEURONTIN) 300 MG capsule Take 300 mg by mouth 3 (three) times daily.   Yes [provider]  LORazepam (ATIVAN) 1 MG tablet Take 1 mg by mouth every 8 (eight) hours.   Yes [provider]  metoprolol tartrate (LOPRESSOR) 50 MG tablet Take 50 mg by mouth 2 (two) times daily.   Yes [provider]  nystatin (MYCOSTATIN/NYSTOP) powder Apply topically  daily as needed (for rash).    Yes [provider]  potassium chloride (K-DUR) 10 MEQ tablet Take 10 mEq by mouth daily.   Yes [provider]  ranitidine (ZANTAC) 300 MG capsule Take 300 mg by mouth every evening.   Yes [provider]  umeclidinium bromide (INCRUSE ELLIPTA) 62.5 MCG/INH AEPB Inhale 1 puff into the lungs daily.   Yes [provider]    Inpatient Medications: Scheduled Meds: . aspirin EC  81 mg Oral Daily  . citalopram  20 mg Oral q morning - 10a  . enoxaparin (LOVENOX) injection  1 mg/kg Subcutaneous Q12H  . famotidine  40 mg Oral QHS  . furosemide  40 mg Intravenous Q12H  . gabapentin  300 mg Oral TID  . lisinopril  5 mg Oral Daily  . Living Better with Heart Failure Book   Does not apply Once  . LORazepam  1 mg Oral Q8H  . metoprolol tartrate  50 mg Oral BID  . nicotine  14 mg Transdermal Daily  . potassium chloride  20 mEq Oral Daily  . sodium chloride flush  3 mL Intravenous Q12H  . umeclidinium bromide  1 puff Inhalation Daily   Continuous Infusions: . sodium chloride     PRN Meds: sodium chloride, acetaminophen, albuterol, ondansetron (ZOFRAN) IV, sodium chloride flush  Allergies:   No Known Allergies  Social History:   Social History   Social History  . Marital status: Married    Spouse name: N/A  . Number of children: N/A  . Years of education: N/A   Occupational History  . retired    Social History Main Topics  . Smoking status: Current Every Day Smoker    Packs/day: 1.00    Years: 43.00    Types: Cigarettes  . Smokeless tobacco: Never Used  . Alcohol use No  . Drug use: No  . Sexual activity: Not on file   Other Topics Concern  . Not on file   Social History Narrative  . No narrative on file    Family History:    Family History  Problem Relation Age of Onset  . Lung cancer Mother   . Stomach cancer Father   . Brain cancer Brother   . Stroke Brother      ROS:  Please see the history of  present illness.  ROS  All other ROS reviewed and negative.     Physical Exam/Data:   Vitals:   09/07/17 1926 09/07/17 1928 09/07/17 2212 09/08/17 0558  BP: (!) 120/101  (!) 161/58 132/77  Pulse: (!) 102  71 71  Resp:   (!) 21 20  Temp:   98.3 F (36.8 C) 97.7 F (36.5 C)  TempSrc:   Axillary Axillary  SpO2:  94% 98% 97%  Weight:    (!) 325 lb 14.4 oz (147.8 kg)  Height:        Intake/Output Summary (Last 24 hours) at 09/08/17 0728 Last data filed at 09/08/17 0241  Gross per 24 hour  Intake               40 ml  Output              200 ml  Net             -160 ml   Filed Weights   09/07/17 1510 09/07/17 1900 09/08/17 0558  Weight: (!) 325 lb (147.4 kg) (!) 322 lb 6.4 oz (146.2 kg) (!) 325 lb 14.4 oz (147.8 kg)   Body mass index is 44.2 kg/m.  General:  Well nourished, well developed, in no acute distress-obese.  HEENT: normal Lymph: no adenopathy Neck: no JVD Endocrine:  No thryomegaly Vascular: No carotid bruits; Unable to assess distal pulses due to massive edema.  Cardiac:  normal S1, S2; RRR; no murmur  Lungs:  Diminished in the right base, crackles in the left base. No wheezing or coughing.  Abd: soft, nontender, no hepatomegaly  Ext: Massive edema of the LEE into the thighs bilaterally. Scrotal edema is also noted. Musculoskeletal:  No deformities, BUE and BLE strength diminished. He has to use his hands lift up his legs to reposition.  Skin: warm and dry  Neuro:  CNs 2-12 intact, no focal abnormalities noted Psych:  Flat  affect   EKG:  The EKG was personally reviewed and demonstrates:  Atrial fib/ flutter, HR of 69 bpm.  Telemetry:  Telemetry was personally reviewed and demonstrates: Atrial flutter with variable conduction, 2:1 -3:1  Relevant CV Studies: 1. 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  2. Kidney  stone removal 3.Back surgeries 4. Neck surgery 5. PTA/stent of right common iliac artery  Aortogram with Runoff 03/30/2011 ANGIOGRAPHIC FINDINGS:  1. The bilateral renal arteries are patent without any significant      stenosis.  2. The infrarenal aorta has diffuse plaque, but no significant      stenosis.  There was a small aneurysm.  3. The right common iliac has a 70% ostial stenosis.  The right      external iliac system has serial 50% lesions.  The right common      femoral artery has no significant stenosis.  The right superficial      femoral artery has an ostial 40% stenosis.  There was diffuse      plaque throughout the remainder of the superficial femoral artery      without any significant stenosis.  There was three-vessel runoff      below the knee in the right leg.  4. The left common iliac artery has plaque.  The left external iliac      artery and common femoral artery has mild plaque.  A left      superficial femoral artery has serial 90% lesions in the proximal      portion starting 1-cm beyond the ostium.  There was mild plaque      throughout the remainder of the left SFA, but no significant      stenosis.  There was three-vessel runoff below the knee in the left      foot.   HEMODYNAMIC DATA:  Central aortic pressure 135/71.   IMPRESSION:  1. Peripheral vascular disease.  2. Severe stenosis of the right common iliac arteries, status post      percutaneous transluminal angioplasty and stent placement.  3. Severe stenosis of the left superficial femoral artery in  the      proximal portion.  4. Small abdominal aortic aneurysm.  Laboratory Data:  Chemistry Recent Labs Lab 09/07/17 1527 09/08/17 0108  NA 137 138  K 2.6* 3.2*  CL 96* 97*  CO2 32 31  GLUCOSE 135* 94  BUN 12 13  CREATININE 0.97 0.93  CALCIUM 9.1 8.9  GFRNONAA >60 >60  GFRAA >60 >60  ANIONGAP 9 10    Hematology Recent Labs Lab 09/07/17 1527 09/08/17 0108  WBC 7.9 7.7  RBC 4.31  4.02*  HGB 13.7 12.6*  HCT 42.4 39.5  MCV 98.4 98.3  MCH 31.8 31.3  MCHC 32.3 31.9  RDW 16.0* 16.0*  PLT 181 177   Cardiac Enzymes Recent Labs Lab 09/07/17 1919 09/08/17 0108  TROPONINI 0.09* 0.15*    Recent Labs Lab 09/07/17 1534  TROPIPOC 0.09*    BNP Recent Labs Lab 09/07/17 1527  BNP 747.0*     Radiology/Studies:  Dg Chest Portable 1 View  Result Date: 09/07/2017 CLINICAL DATA:  Fluid retention, shortness of breath, and irregular heartbeat. Current smoker. History of COPD, cardiac ablation, morbid obesity. EXAM: PORTABLE CHEST 1 VIEW COMPARISON:  Chest x-ray of May 23, 2017 FINDINGS: The cardiopericardial silhouette remains enlarged. The pulmonary vascularity is engorged and there is mild cephalization. The interstitial markings are both lungs are mildly increased. There is no pleural effusion. There is calcification in the wall of the aortic arch. The observed bony thorax is unremarkable. IMPRESSION: CHF with mild interstitial edema.  No alveolar pneumonia. Thoracic aortic atherosclerosis. Electronically Signed   By: David  [place] M.D.   On: 09/07/2017 15:53    Assessment and Plan:   1. Acute on Chronic (probable) mixed CHF:  Massive fluid overload is noted. Will review echo results once available to assist in medical management of fluid overload. He is currently on IV lasix 40 mg BID. Will increased to 80 mg BID. May need to give one doe of metolazone as well. Creatinine 0/93 this am. Strict I/O will be ordered along with daily weights. Fluid restriction may be necessary.   2. Atrial fib/flutter: Has had previous ablation for SVT. Not currently on anticoagulation. CHADS VASC Score of at least 3. Will begin Eliquis 5 mg BID.Heart rate is well controlled currently on metoprolol 50 mg BID. Will stop ASA as he will begin DOAC.   3. PVD: Hx of prior iliac stenting. Difficult to evaluate pulses due to extreme LEE.  4. Hypertension: BP elevated on admission but normalized  now on metoprolol, lisinopril, and diuretics. Will make adjustments once echo is resulted.   5. Ongoing tobacco abuse: Now on nicotine patch. High likelihood of progressive CAD with ongoing abuse.   6. OSA: Question need for CPAP during this admission. May be contributing to atrial fib/flutter.    For questions or updates, please contact [facility] Please consult [email or web address] for contact info under Cardiology/STEMI.   Signed, Joni Reining DNP, ANP, AACC  09/08/2017 7:28 AM   Patient examined chart reviewed. Obese white male with massive volume overload. Plus 3 LE edema. Basilar crackles no murmur Afib/flutter with good rate control Has RUE and RLE weakness from Cervical spine disease and needs surgery with Dr Channing Mutters. Will need days Of iv diuresis Start DOAC for anticoagulation and may need [facility] prior to any surgery as well  Echo ordered for today to assess EF  Charlton Haws

## 2017-09-08 NOTE — Progress Notes (Signed)
Dr. Robb Matar notified of troponin of 0.15. New orders for magnesium ivp.

## 2017-09-08 NOTE — Progress Notes (Signed)
[facility]  PROGRESS NOTE  Edward Andrews ZOX:[id number] DOB: Feb 17, 1951 DOA: 09/07/2017 PCP: Toma Deiters, MD   Brief HPI:    66 y.o. male with medical history significant of PAD; anxiety/depression/panic; OSA not on CPAP; HLD; COPD; and h/o AVNRT s/p ablation in 2010 presenting from Dr. Junius Argyle office with apparent CHF exacerbation.  He complains of swelling, fluid, numbness, weakness.  Can't breathe, SOB.  Constipated.  Symptoms have been going on for 6-8 weeks.  Symptoms started right after his last PCP visit.  Today, he went to the cardiologist and he sent him to [facility] for admission.  SOB for the last week all the time, previously it was intermittent but not necessarily exertional.  Unable to walk around due to back pain.  Previously slept on 2 pillows but now sleeping in a recliner.  No PND.  No chest pain but he has had jaw pain, comes and goes, maybe 6 total times but very transient; he associated it with his neck.  Constipation is chronic, he is taking pain medication.  Linzess makes him have fecal urgency and he can't get to the bathroom soon enough so he doesn't take it much.  He was supposed to have back surgery on Oct 19 and he was sent to cardiology for cardiac clearance.     Subjective   Patient seen and examined, denies shortness of breath or chest pain.   Assessment/Plan:     1. Acute on chronic combined systolic and diastolic CHF- improved with IV diuresis. Dose of Lasix changed to 80 mg twice a day per cardiology. Follow strict intake and output. Check BMP in a.m. 2. Hypokalemia-replace potassium and check BMP in a.m. 3. Atrial fib/flutter-patient is status post ablation for SVT. CHA2DS2VASc score is 3. Continue eliquis 5 mg by mouth twice a day. Heart is controlled on metoprolol. 4. Hypertension- blood pressure stable, continue metoprolol, lisinopril 5. Tobacco dependence- continue nicotine patch 6. Morbid obesity    DVT prophylaxis: Lovenox  Code  Status: Full code  Family Communication: No family at bedside   Disposition Plan: Likely home in 1-2 days.   Consultants:  Cardiology  Procedures:  None  Continuous infusions . sodium chloride        Antibiotics:   Anti-infectives    None       Objective   Vitals:   09/07/17 2212 09/08/17 0558 09/08/17 1203 09/08/17 1424  BP: (!) 161/58 132/77  117/63  Pulse: 71 71  (!) 52  Resp: (!) Temp: 98.3 F (36.8 C) 97.7 F (36.5 C)  98.6 F (37 C)  TempSrc: Axillary Axillary    SpO2: 98% 97% 94% 95%  Weight:  (!) 147.8 kg (325 lb 14.4 oz)    Height:        Intake/Output Summary (Last 24 hours) at 09/08/17 1556 Last data filed at 09/08/17 1300  Gross per 24 hour  Intake              640 ml  Output             1600 ml  Net             -960 ml   Filed Weights   09/07/17 1510 09/07/17 1900 09/08/17 0558  Weight: (!) 147.4 kg (325 lb) (!) 146.2 kg (322 lb 6.4 oz) (!) 147.8 kg (325 lb 14.4 oz)     Physical Examination:   Physical Exam: Eyes: No icterus, extraocular muscles intact  Mouth:  Oral mucosa is moist, no lesions on palate,  Neck: Supple, no deformities, masses, or tenderness Lungs: Normal respiratory effort, bilateral clear to auscultation, no crackles or wheezes.  Heart: Regular rate and rhythm, S1 and S2 normal, no murmurs, rubs auscultated Abdomen: BS normoactive,soft,nondistended,non-tender to palpation,no organomegaly Extremities: Bilateral 2+ pitting edema of the lower extremities Neuro : Alert and oriented to time, place and person, No focal deficits  Skin: No rashes seen on exam     Data Reviewed: I have personally reviewed following labs and imaging studies  CBG: No results for input(s): GLUCAP in the last 168 hours.  CBC:  Recent Labs Lab 09/07/17 1527 09/08/17 0108  WBC 7.9 7.7  NEUTROABS  --  3.8  HGB 13.7 12.6*  HCT 42.4 39.5  MCV 98.4 98.3  PLT 181 177    Basic Metabolic Panel:  Recent Labs Lab  09/07/17 1527 09/07/17 1919 09/08/17 0108  NA 137  --  138  K 2.6*  --  3.2*  CL 96*  --  97*  CO2 32  --  31  GLUCOSE 135*  --  94  BUN 12  --  13  CREATININE 0.97  --  0.93  CALCIUM 9.1  --  8.9  MG  --  2.0  --     No results found for this or any previous visit (from the past 240 hour(s)).   Liver Function Tests: No results for input(s): AST, ALT, ALKPHOS, BILITOT, PROT, ALBUMIN in the last 168 hours. No results for input(s): LIPASE, AMYLASE in the last 168 hours. No results for input(s): AMMONIA in the last 168 hours.  Cardiac Enzymes:  Recent Labs Lab 09/07/17 1919 09/08/17 0108 09/08/17 0706  TROPONINI 0.09* 0.15* 0.12*   BNP (last 3 results)  Recent Labs  09/07/17 1527  BNP 747.0*    ProBNP (last 3 results) No results for input(s): PROBNP in the last 8760 hours.    Studies: Dg Chest Portable 1 View  Result Date: 09/07/2017 CLINICAL DATA:  Fluid retention, shortness of breath, and irregular heartbeat. Current smoker. History of COPD, cardiac ablation, morbid obesity. EXAM: PORTABLE CHEST 1 VIEW COMPARISON:  Chest x-ray of May 23, 2017 FINDINGS: The cardiopericardial silhouette remains enlarged. The pulmonary vascularity is engorged and there is mild cephalization. The interstitial markings are both lungs are mildly increased. There is no pleural effusion. There is calcification in the wall of the aortic arch. The observed bony thorax is unremarkable. IMPRESSION: CHF with mild interstitial edema.  No alveolar pneumonia. Thoracic aortic atherosclerosis. Electronically Signed   By: David  [place] M.D.   On: 09/07/2017 15:53    Scheduled Meds: . apixaban  5 mg Oral BID  . citalopram  20 mg Oral q morning - 10a  . famotidine  40 mg Oral QHS  . furosemide  80 mg Intravenous Q12H  . gabapentin  300 mg Oral TID  . lisinopril  5 mg Oral Daily  . Living Better with Heart Failure Book   Does not apply Once  . LORazepam  1 mg Oral Q8H  . metoprolol tartrate  50  mg Oral BID  . nicotine  14 mg Transdermal Daily  . potassium chloride  40 mEq Oral BID  . sodium chloride flush  3 mL Intravenous Q12H  . umeclidinium bromide  1 puff Inhalation Daily      Time spent: 25 min  [facility]   [facility] Pager [phone]. If 7PM-7AM, please contact night-coverage at [email or web address], Office  [phone]  password [email or web address]  09/08/2017, 3:56 PM  LOS: 1 day

## 2017-09-08 NOTE — Evaluation (Signed)
Physical Therapy Evaluation Patient Details Name: Edward Andrews MRN: [id number] DOB: 21-May-1951 Today's Date: 09/08/2017   History of Present Illness  Edward Andrews is a 66 y.o. male with medical history significant of PAD; anxiety/depression/panic; OSA not on CPAP; HLD; COPD; and h/o AVNRT s/p ablation in 2010 presenting from Dr. Junius Argyle office with apparent CHF exacerbation.  He complains of swelling, fluid, numbness, weakness.  Can't breathe, SOB.  Constipated.  Symptoms have been going on for 6-8 weeks.  Symptoms started right after his last PCP visit.  Today, he went to the cardiologist and he sent him to [facility] for admission.  SOB for the last week all the time, previously it was intermittent but not necessarily exertional.  Unable to walk around due to back pain.  Previously slept on 2 pillows but now sleeping in a recliner.  No PND.  No chest pain but he has had jaw pain, comes and goes, maybe 6 total times but very transient; he associated it with his neck.  Constipation is chronic, he is taking pain medication.  Linzess makes him have fecal urgency and he can't get to the bathroom soon enough so he doesn't take it much.  He was supposed to have back surgery on Oct 19 and he was sent to cardiology for cardiac clearance.    Clinical Impression  Patient has mild contracture of right ankle plantar flexors with limited dorsiflexion -15 degrees which is baseline per patient for last 2 years possibly due to nerve problems in back.  Able to stretch right ankle plantar flexors past neutral up to 5 degrees dorsi flexion and patient provided instruction in self stretching with understanding acknowledged.  Patient may be a candidate for right ankle foot orthosis (AFO) to prevent right toe drag if able to keep right ankle at 90 degrees comfortably.  Patient will benefit from continued physical therapy in hospital and recommended venue below to increase strength, balance, endurance for safe ADLs and gait.     Follow Up Recommendations Home health PT;Supervision - Intermittent    Equipment Recommendations  None recommended by PT    Recommendations for Other Services       Precautions / Restrictions Precautions Precautions: Fall Precaution Comments: right toe drag due to limited ankle dorsiflexion Restrictions Weight Bearing Restrictions: No      Mobility  Bed Mobility Overal bed mobility: Modified Independent                Transfers Overall transfer level: Needs assistance Equipment used: Rolling walker (2 wheeled) Transfers: Sit to/from [employer] Sit to Stand: Min guard Stand pivot transfers: Min guard       General transfer comment: Patient requires verbal cues for proper hand placement and to look over toes for sit to stands with fair/good return demonstrated  Ambulation/Gait Ambulation/Gait assistance: Supervision Ambulation Distance (Feet): 40 Feet Assistive device: Rolling walker (2 wheeled) Gait Pattern/deviations: Decreased step length - right;Decreased step length - left;Decreased stride length;Decreased dorsiflexion - right Gait velocity: slow Gait velocity interpretation: Below normal speed for age/gender General Gait Details: Patient tends to slide/drage right foot due to unable to clear toes when attempting to walk normally, no loss of balance  Stairs            Wheelchair Mobility    Modified Rankin (Stroke Patients Only)       Balance Overall balance assessment: Needs assistance Sitting-balance support: No upper extremity supported;Feet supported Sitting balance-Leahy Scale: Good     Standing balance  support: Bilateral upper extremity supported;During functional activity Standing balance-Leahy Scale: Fair                               Pertinent Vitals/Pain      Home Living Family/patient expects to be discharged to:: Private residence Living Arrangements: Spouse/significant other;Other  relatives Available Help at Discharge: Family;Available 24 hours/day Type of Home: House Home Access: Ramped entrance (has bilateral railings on ramp, uses  right side going into house)     Home Layout: One level Home Equipment: Walker - 2 wheels;Cane - single point;Wheelchair - manual;Shower seat      Prior Function Level of Independence: Independent with assistive device(s)   Gait / Transfers Assistance Needed: Uses walker in house and to front porch; uses wheelchair for community            Hand Dominance   Dominant Hand: Right    Extremity/Trunk Assessment   Upper Extremity Assessment Upper Extremity Assessment: Defer to OT evaluation    Lower Extremity Assessment Lower Extremity Assessment: Generalized weakness    Cervical / Trunk Assessment Cervical / Trunk Assessment: Normal  Communication   Communication: No difficulties  Cognition Arousal/Alertness: Awake/alert Behavior During Therapy: WFL for tasks assessed/performed Overall Cognitive Status: Within Functional Limits for tasks assessed                                        General Comments      Exercises General Exercises - Lower Extremity Ankle Circles/Pumps: Seated;AAROM;Right;5 reps (passive stretching ot right heelcords manually and having patient prop right forefoot on  rolled up towel and push heel to floor to stretch calf with 1-2 minute holds)   Assessment/Plan    PT Assessment Patient needs continued PT services  PT Problem List Decreased strength;Decreased activity tolerance;Decreased balance;Decreased mobility       PT Treatment Interventions Gait training;Functional mobility training;Therapeutic activities;Therapeutic exercise;Patient/family education    PT Goals (Current goals can be found in the Care Plan section)  Acute Rehab PT Goals Patient Stated Goal: return home PT Goal Formulation: With patient Time For Goal Achievement: 09/12/17 Potential to Achieve  Goals: Good    Frequency Min 3X/week   Barriers to discharge        Co-evaluation               AM-PAC PT "6 Clicks" Daily Activity  Outcome Measure Difficulty turning over in bed (including adjusting bedclothes, sheets and blankets)?: None Difficulty moving from lying on back to sitting on the side of the bed? : None Difficulty sitting down on and standing up from a chair with arms (e.g., wheelchair, bedside commode, etc,.)?: None Help needed moving to and from a bed to chair (including a wheelchair)?: A Little Help needed walking in hospital room?: A Little Help needed climbing 3-5 steps with a railing? : A Little 6 Click Score: 21    End of Session   Activity Tolerance: Patient tolerated treatment well;Patient limited by fatigue Patient left: in chair;with call bell/phone within reach Nurse Communication: Mobility status PT Visit Diagnosis: Unsteadiness on feet (R26.81);Other abnormalities of gait and mobility (R26.89);Muscle weakness (generalized) (M62.81)    Time: 1000-1029 PT Time Calculation (min) (ACUTE ONLY): 29 min   Charges:   PT Evaluation $PT Eval Low Complexity: 1 Low PT Treatments $Therapeutic Activity: 23-37 mins   PT  G Codes:   PT G-Codes **NOT FOR INPATIENT CLASS** Functional Assessment Tool Used: AM-PAC 6 Clicks Basic Mobility Functional Limitation: Mobility: Walking and moving around Mobility: Walking and Moving Around Current Status (O6712): At least 20 percent but less than 40 percent impaired, limited or restricted Mobility: Walking and Moving Around Goal Status [phone]): At least 20 percent but less than 40 percent impaired, limited or restricted Mobility: Walking and Moving Around Discharge Status [phone]): At least 20 percent but less than 40 percent impaired, limited or restricted    2:34 PM, 09/08/17 Ocie Bob, MPT Physical Therapist with [facility] 336 [phone] office [phone] mobile phone

## 2017-09-08 NOTE — Progress Notes (Signed)
[facility] notified of critical troponin of 0.09. No new orders

## 2017-09-08 NOTE — Progress Notes (Signed)
*  PRELIMINARY RESULTS* Echocardiogram 2D Echocardiogram has been performed.  Stacey Drain 09/08/2017, 10:53 AM

## 2017-09-09 DIAGNOSIS — I5023 Acute on chronic systolic (congestive) heart failure: Secondary | ICD-10-CM

## 2017-09-09 LAB — BASIC METABOLIC PANEL
Anion gap: 10 (ref 5–15)
BUN: 19 mg/dL (ref 6–20)
CO2: 32 mmol/L (ref 22–32)
Calcium: 9 mg/dL (ref 8.9–10.3)
Chloride: 96 mmol/L — ABNORMAL LOW (ref 101–111)
Creatinine, Ser: 1.17 mg/dL (ref 0.61–1.24)
GFR calc Af Amer: >60 mL/min (ref >60)
GLUCOSE: 90 mg/dL (ref 65–99)
POTASSIUM: 3.8 mmol/L (ref 3.5–5.1)
Sodium: 138 mmol/L (ref 135–145)

## 2017-09-09 LAB — CBC
HEMATOCRIT: 40.7 % (ref 39.0–52.0)
Hemoglobin: 12.7 g/dL — ABNORMAL LOW (ref 13.0–17.0)
MCH: 31.2 pg (ref 26.0–34.0)
MCHC: 31.2 g/dL (ref 30.0–36.0)
MCV: 100 fL (ref 78.0–100.0)
Platelets: 178 10*3/uL (ref 150–400)
RBC: 4.07 MIL/uL — ABNORMAL LOW (ref 4.22–5.81)
RDW: 16.4 % — AB (ref 11.5–15.5)
WBC: 8.3 10*3/uL (ref 4.0–10.5)

## 2017-09-09 MED ORDER — METOPROLOL TARTRATE 25 MG PO TABS
12.5000 mg | ORAL_TABLET | Freq: Two times a day (BID) | ORAL | Status: DC
  Administered 2017-09-09 – 2017-09-14 (×11): 12.5 mg via ORAL
  Filled 2017-09-09 (×11): qty 1

## 2017-09-09 MED ORDER — METOLAZONE 5 MG PO TABS
2.5000 mg | ORAL_TABLET | Freq: Once | ORAL | Status: AC
  Administered 2017-09-09: 2.5 mg via ORAL
  Filled 2017-09-09: qty 1

## 2017-09-09 NOTE — Care Management Important Message (Signed)
Important Message  Patient Details  Name: Edward Andrews MRN: [id number] Date of Birth: 09/07/1951   Medicare Important Message Given:  Yes    Malcolm Metro, RN 09/09/2017, 1:45 PM

## 2017-09-09 NOTE — Progress Notes (Signed)
[facility]  PROGRESS NOTE  Edward Andrews ZOX:[id number] DOB: 1951/10/06 DOA: 09/07/2017 PCP: Toma Deiters, MD   Brief HPI:    66 y.o. male with medical history significant of PAD; anxiety/depression/panic; OSA not on CPAP; HLD; COPD; and h/o AVNRT s/p ablation in 2010 presenting from Dr. Junius Argyle office with apparent CHF exacerbation.  He complains of swelling, fluid, numbness, weakness.  Can't breathe, SOB.  Constipated.  Symptoms have been going on for 6-8 weeks.  Symptoms started right after his last PCP visit.  Today, he went to the cardiologist and he sent him to [facility] for admission.  SOB for the last week all the time, previously it was intermittent but not necessarily exertional.  Unable to walk around due to back pain.  Previously slept on 2 pillows but now sleeping in a recliner.  No PND.  No chest pain but he has had jaw pain, comes and goes, maybe 6 total times but very transient; he associated it with his neck.  Constipation is chronic, he is taking pain medication.  Linzess makes him have fecal urgency and he can't get to the bathroom soon enough so he doesn't take it much.  He was supposed to have back surgery on Oct 19 and he was sent to cardiology for cardiac clearance.     Subjective   Patient seen and examined, still has bilateral leg swelling. Dose of Lasix has been increased per cardiology.   Assessment/Plan:     1. Acute on chronic combined systolic and diastolic CHF- improved with IV diuresis.Patient is currently on IV Lasix 80 mg twice a day per cardiology. Net -4.3 liters. Cardiology recommended continuing with IV diuresis for next 4-5 days 2. Hypokalemia- replete, follow BMP in a.m. 3. Atrial fib/flutter-patient is status post ablation for SVT. CHA2DS2VASc score is 3. Continue eliquis 5 mg by mouth twice a day. Heart rate  is controlled on metoprolol. 4. Hypertension- blood pressure stable, continue metoprolol, lisinopril 5. Tobacco dependence- continue  nicotine patch 6. COPD-stable, continue Umecidinium. 7. Morbid obesity    DVT prophylaxis: Lovenox  Code Status: Full code  Family Communication: No family at bedside   Disposition Plan: Likely home in 1-2 days.   Consultants:  Cardiology  Procedures:  None  Continuous infusions . sodium chloride        Antibiotics:   Anti-infectives    None       Objective   Vitals:   09/08/17 1424 09/08/17 2249 09/09/17 0502 09/09/17 1049  BP: 117/63 (!) 110/59 132/72   Pulse: (!) 52 60 68   Resp: Temp: 98.6 F (37 C) 98.5 F (36.9 C) 97.7 F (36.5 C)   TempSrc:  Oral Oral   SpO2: 95% 95% 98% 99%  Weight:   (!) 147.8 kg (325 lb 13.8 oz)   Height:        Intake/Output Summary (Last 24 hours) at 09/09/17 1344 Last data filed at 09/09/17 1259  Gross per 24 hour  Intake              560 ml  Output             3950 ml  Net            -3390 ml   Filed Weights   09/07/17 1900 09/08/17 0558 09/09/17 0502  Weight: (!) 146.2 kg (322 lb 6.4 oz) (!) 147.8 kg (325 lb 14.4 oz) (!) 147.8 kg (325 lb 13.8 oz)  Physical Examination:   Physical Exam: Eyes: No icterus, extraocular muscles intact  Mouth: Oral mucosa is moist, no lesions on palate,  Neck: Supple, no deformities, masses, or tenderness Lungs: Normal respiratory effort, bilateral clear to auscultation, no crackles or wheezes.  Heart: Regular rate and rhythm, S1 and S2 normal, no murmurs, rubs auscultated Abdomen: BS normoactive,soft,nondistended,non-tender to palpation,no organomegaly Extremities: Bilateral 2+ pitting edema of the lower extremities Neuro : Alert and oriented to time, place and person, No focal deficits      Data Reviewed: I have personally reviewed following labs and imaging studies  CBG: No results for input(s): GLUCAP in the last 168 hours.  CBC:  Recent Labs Lab 09/07/17 1527 09/08/17 0108 09/09/17 0534  WBC 7.9 7.7 8.3  NEUTROABS  --  3.8  --   HGB 13.7  12.6* 12.7*  HCT 42.4 39.5 40.7  MCV 98.4 98.3 100.0  PLT 181 177 178    Basic Metabolic Panel:  Recent Labs Lab 09/07/17 1527 09/07/17 1919 09/08/17 0108 09/09/17 0534  NA 137  --  138 138  K 2.6*  --  3.2* 3.8  CL 96*  --  97* 96*  CO2 32  --  31 32  GLUCOSE 135*  --  94 90  BUN 12  --  13 19  CREATININE 0.97  --  0.93 1.17  CALCIUM 9.1  --  8.9 9.0  MG  --  2.0  --   --     No results found for this or any previous visit (from the past 240 hour(s)).   Liver Function Tests: No results for input(s): AST, ALT, ALKPHOS, BILITOT, PROT, ALBUMIN in the last 168 hours. No results for input(s): LIPASE, AMYLASE in the last 168 hours. No results for input(s): AMMONIA in the last 168 hours.  Cardiac Enzymes:  Recent Labs Lab 09/07/17 1919 09/08/17 0108 09/08/17 0706  TROPONINI 0.09* 0.15* 0.12*   BNP (last 3 results)  Recent Labs  09/07/17 1527  BNP 747.0*    ProBNP (last 3 results) No results for input(s): PROBNP in the last 8760 hours.    Studies: Dg Chest Portable 1 View  Result Date: 09/07/2017 CLINICAL DATA:  Fluid retention, shortness of breath, and irregular heartbeat. Current smoker. History of COPD, cardiac ablation, morbid obesity. EXAM: PORTABLE CHEST 1 VIEW COMPARISON:  Chest x-ray of May 23, 2017 FINDINGS: The cardiopericardial silhouette remains enlarged. The pulmonary vascularity is engorged and there is mild cephalization. The interstitial markings are both lungs are mildly increased. There is no pleural effusion. There is calcification in the wall of the aortic arch. The observed bony thorax is unremarkable. IMPRESSION: CHF with mild interstitial edema.  No alveolar pneumonia. Thoracic aortic atherosclerosis. Electronically Signed   By: David  [place] M.D.   On: 09/07/2017 15:53    Scheduled Meds: . apixaban  5 mg Oral BID  . citalopram  20 mg Oral q morning - 10a  . famotidine  40 mg Oral QHS  . furosemide  80 mg Intravenous Q12H  .  gabapentin  300 mg Oral TID  . lisinopril  5 mg Oral Daily  . LORazepam  1 mg Oral Q8H  . metoprolol tartrate  50 mg Oral BID  . nicotine  14 mg Transdermal Daily  . potassium chloride  40 mEq Oral BID  . sodium chloride flush  3 mL Intravenous Q12H  . umeclidinium bromide  1 puff Inhalation Daily      Time spent: 25 min  Seaborn Nakama  S   [facility] Pager [phone]. If 7PM-7AM, please contact night-coverage at [email or web address], Office  [phone]  password [email or web address]  09/09/2017, 1:44 PM  LOS: 2 days

## 2017-09-09 NOTE — Care Management Note (Signed)
Case Management Note  Patient Details  Name: Edward Andrews MRN: [id number] Date of Birth: March 09, 1951  Subjective/Objective:                  Admitted with CHF. Wife and son at bedside for assessment, pt states it okay for [place] to talk with family present, family answers many questions for patient and he becomes very agitated. Pt lives with wife and son, he reports being ind with ADL's His wife disagrees, with clarification he does bath, dress and feed himself. Pt is non-compliant with diet, has nabs and soda at the bedside. He uses RW. He was supposed to have back surgery later this month, that has been postponed. PT has recommended HH PT, he does not want to do [facility] PT until after surgery. Pt has been seen by dietitian. Pt says HF is a new dx despite active problem being acute on chronic HF. Per cardiology pt will be here for many more days diuresing.   Action/Plan: CM will follow, referred to [facility] for emmi transition calls, given voucher for first 30 days of eliquis free.   Expected Discharge Date:     09/13/2017             Expected Discharge Plan:  Home/Self Care  In-House Referral:  NA  Discharge planning Services  CM Consult  Post Acute Care Choice:  NA Choice offered to:  NA  Status of Service:  Completed, signed off  Malcolm Metro, RN 09/09/2017, 1:46 PM

## 2017-09-09 NOTE — Progress Notes (Signed)
Called concerning significant bradycardia while asleep. HR of 20 bpm. I will decrease metoprolol to 12. 6 mg BID from 50 mg BID to allow HR to increase. CPAP is initiated.

## 2017-09-09 NOTE — Progress Notes (Signed)
Pt had 3.77 second pause on tele, HR decreased into the 20's. Pt asymptomatic. Joni Reining paged and made aware.

## 2017-09-09 NOTE — Progress Notes (Signed)
Progress Note  Patient Name: Edward Andrews Date of Encounter: 09/09/2017  Primary Cardiologist: Prentice Docker, MD  Subjective   Continues complaints of LEE and pain. No chest pain  Inpatient Medications    Scheduled Meds: . apixaban  5 mg Oral BID  . citalopram  20 mg Oral q morning - 10a  . famotidine  40 mg Oral QHS  . furosemide  80 mg Intravenous Q12H  . gabapentin  300 mg Oral TID  . lisinopril  5 mg Oral Daily  . Living Better with Heart Failure Book   Does not apply Once  . LORazepam  1 mg Oral Q8H  . metoprolol tartrate  50 mg Oral BID  . nicotine  14 mg Transdermal Daily  . potassium chloride  40 mEq Oral BID  . sodium chloride flush  3 mL Intravenous Q12H  . umeclidinium bromide  1 puff Inhalation Daily   Continuous Infusions: . sodium chloride     PRN Meds: sodium chloride, acetaminophen, albuterol, ondansetron (ZOFRAN) IV, sodium chloride flush   Vital Signs    Vitals:   09/08/17 1203 09/08/17 1424 09/08/17 2249 09/09/17 0502  BP:  117/63 (!) 110/59 132/72  Pulse:  (!) 52 60 68  Resp:  Temp:  98.6 F (37 C) 98.5 F (36.9 C) 97.7 F (36.5 C)  TempSrc:   Oral Oral  SpO2: 94% 95% 95% 98%  Weight:    (!) 325 lb 13.8 oz (147.8 kg)  Height:        Intake/Output Summary (Last 24 hours) at 09/09/17 0813 Last data filed at 09/09/17 0649  Gross per 24 hour  Intake              600 ml  Output             4400 ml  Net            -3800 ml   Filed Weights   09/07/17 1900 09/08/17 0558 09/09/17 0502  Weight: (!) 322 lb 6.4 oz (146.2 kg) (!) 325 lb 14.4 oz (147.8 kg) (!) 325 lb 13.8 oz (147.8 kg)    Telemetry    Atrial flutter, HR 40's and 50's  - Personally Reviewed   Physical Exam   GEN: No acute distress.   Neck: No JVD Cardiac: RRR, soft 1/6 systolic murmurs, rubs, or gallops.  Respiratory: Clear to auscultation bilaterally. GI: Soft, nontender, mildly distended  MS: Massive LEE No deformity.Unable to palpate distal pulses in  LEE due to edema.  Neuro:  Nonfocal  Psych: Normal affect   Labs    Chemistry Recent Labs Lab 09/07/17 1527 09/08/17 0108 09/09/17 0534  NA 137 138 138  K 2.6* 3.2* 3.8  CL 96* 97* 96*  CO2 32 31 32  GLUCOSE 135* 94 90  BUN CREATININE 0.97 0.93 1.17  CALCIUM 9.1 8.9 9.0  GFRNONAA >60 >60 >60  GFRAA >60 >60 >60  ANIONGAP Hematology Recent Labs Lab 09/07/17 1527 09/08/17 0108 09/09/17 0534  WBC 7.9 7.7 8.3  RBC 4.31 4.02* 4.07*  HGB 13.7 12.6* 12.7*  HCT 42.4 39.5 40.7  MCV 98.4 98.3 100.0  MCH 31.8 31.3 31.2  MCHC 32.3 31.9 31.2  RDW 16.0* 16.0* 16.4*  PLT 181 177 178    Cardiac Enzymes Recent Labs Lab 09/07/17 1919 09/08/17 0108 09/08/17 0706  TROPONINI 0.09* 0.15* 0.12*    Recent Labs Lab 09/07/17  1534  TROPIPOC 0.09*     BNP Recent Labs Lab 09/07/17 1527  BNP 747.0*     Radiology    Dg Chest Portable 1 View  Result Date: 09/07/2017 CLINICAL DATA:  Fluid retention, shortness of breath, and irregular heartbeat. Current smoker. History of COPD, cardiac ablation, morbid obesity. EXAM: PORTABLE CHEST 1 VIEW COMPARISON:  Chest x-ray of May 23, 2017 FINDINGS: The cardiopericardial silhouette remains enlarged. The pulmonary vascularity is engorged and there is mild cephalization. The interstitial markings are both lungs are mildly increased. There is no pleural effusion. There is calcification in the wall of the aortic arch. The observed bony thorax is unremarkable. IMPRESSION: CHF with mild interstitial edema.  No alveolar pneumonia. Thoracic aortic atherosclerosis. Electronically Signed   By: David  [place] M.D.   On: 09/07/2017 15:53    Cardiac Studies   Echocardiogram 09/08/2017 Left ventricle: The cavity size was normal. Systolic function was   normal. The estimated ejection fraction was in the range of 50%   to 55%. Wall motion was normal; there were no regional wall   motion abnormalities. The study is not technically  sufficient to   allow evaluation of LV diastolic function. - Mitral valve: Calcified annulus. Mildly thickened leaflets .   There was mild regurgitation. - Atrial septum: No defect or patent foramen ovale was identified.   - Pulmonary arteries: PA peak pressure: 41 mm Hg (S). 1. PROCEDURE: On March 03, 2009, electrophysiology study and radiofrequency  catheter ablation of atrioventricular nodal reentry tachycardia,  modification of the slow accessory pathway.  2. Kidney stone removal 3.Back surgeries 4. Neck surgery 5. PTA/stent of right common iliac artery  Aortogram with Runoff 03/30/2011 ANGIOGRAPHIC FINDINGS: 1. The bilateral renal arteries are patent without any significant stenosis. 2. The infrarenal aorta has diffuse plaque, but no significant stenosis. There was a small aneurysm. 3. The right common iliac has a 70% ostial stenosis. The right external iliac system has serial 50% lesions. The right common femoral artery has no significant stenosis. The right superficial femoral artery has an ostial 40% stenosis. There was diffuse plaque throughout the remainder of the superficial femoral artery without any significant stenosis. There was three-vessel runoff below the knee in the right leg. 4. The left common iliac artery has plaque. The left external iliac artery and common femoral artery has mild plaque. A left superficial femoral artery has serial 90% lesions in the proximal portion starting 1-cm beyond the ostium. There was mild plaque throughout the remainder of the left SFA, but no significant stenosis. There was three-vessel runoff below the knee in the left foot.  HEMODYNAMIC DATA: Central aortic pressure 135/71.  IMPRESSION: 1. Peripheral vascular disease. 2. Severe stenosis of the right common iliac arteries, status post percutaneous transluminal angioplasty and  stent placement. 3. Severe stenosis of the left superficial femoral artery in the proximal portion. 4. Small abdominal aortic aneurysm.   Patient Profile     66 y.o. male with history of HTN, PVD (s/p right common iliac stenting, 2010), non-obstructive CAD per cath in 2010, s/p ablation in the setting of SVT, hyperlipidemia, COPD, morbid obesity, and cervical spine disease.   Assessment & Plan    1. Acute on Chronic (probable) mixed CHF:  Will review echo results once available to assist in medical management of fluid overload. He has diuresed 3.9 liters since admission with continued massive LEE. On lasix 80 mg IV BID. Will give one dose of metolazone, 2.5 mg X 1 today. He is  advised on keeping is legs elevated when he is not eating his meals, even when in recliner.   2. Atrial fib/flutter: Has had previous ablation for SVT.  CHADS VASC Score of at least 3. Now on Eliquis 5 mg BID.Heart rate is well controlled currently on metoprolol 50 mg BID. Will stop ASA as he will begin DOAC.   3. PVD: Hx of prior iliac stenting. Difficult to evaluate pulses due to extreme LEE.  4. Hypertension:  Controlled on metoprolol and lisinopril   5. Ongoing tobacco abuse: Now on nicotine patch. High likelihood of progressive CAD with ongoing abuse.   6. OSA: Question need for CPAP during this admission. May be contributing to atrial fib/flutter.   Signed, Bettey Mare. Liborio Nixon, ANP, AACC   09/09/2017, 8:13 AM    Patient doing same. Telemetry with some slow flutter may need to decrease metoprolol in future. Still with gross Volume overload with edema to mid thigh. Continue iv diuresis Will be in hospital another 5 days at least No need For lasix drip at this time Lungs clear anteriorly no murmur plus 3 bilateral LE edema  Charlton Haws

## 2017-09-10 DIAGNOSIS — R001 Bradycardia, unspecified: Secondary | ICD-10-CM

## 2017-09-10 LAB — BASIC METABOLIC PANEL
Anion gap: 11 (ref 5–15)
BUN: 19 mg/dL (ref 6–20)
CALCIUM: 9.4 mg/dL (ref 8.9–10.3)
CO2: 34 mmol/L — ABNORMAL HIGH (ref 22–32)
CREATININE: 1.09 mg/dL (ref 0.61–1.24)
Chloride: 91 mmol/L — ABNORMAL LOW (ref 101–111)
GFR calc non Af Amer: >60 mL/min (ref >60)
Glucose, Bld: 94 mg/dL (ref 65–99)
Potassium: 3.4 mmol/L — ABNORMAL LOW (ref 3.5–5.1)
SODIUM: 136 mmol/L (ref 135–145)

## 2017-09-10 NOTE — Progress Notes (Signed)
PROGRESS NOTE                                                                                                                                                                                                             Patient Demographics:    Edward Andrews, is a 66 y.o. male, DOB - 08-30-1951, ZOX:[id number]  Admit date - 09/07/2017   Admitting Physician Jonah Blue, MD  Outpatient Primary MD for the patient is Toma Deiters, MD  LOS - 3  Outpatient Specialists: cardiology  Chief Complaint  Patient presents with  . Groin Swelling  . Leg Swelling       Brief Narrative   66 year old male with history of anxiety, depression, OSA not on CPAP, COPD, hyperlipidemia, history of AVNRT status post ablation in 2010 and peripheral artery disease presented from cardiology office with acute CHF exacerbation. Patient reported increased swelling of his legs associated with shortness of breath and weakness.symptoms have been ongoing for 6-8 weeks and worsening for the past 1 week.reports 2 pillow orthopnea but no PND. Denied any chest pain. Has chronic constipation. Patient was seen in cardiology office for cardiac clearance for upcoming back surgery on 10/19.     Subjective:   Informs his breathing to much better (80% of his baseline)   Assessment  & Plan :    Principal Problem: Acute on chronic combined systolic and diastolic CHF. Patient is over by almost 30 pounds of his baseline weight (295 pounds). Continue IV Lasix 80 mg twice a day and has good diuresis (-7.2 L since admission). Received her dose metolazone on 10/12. Strict I/O and daily weight. Keep legs elevated at all times. Beta blocker dose reduced due to bradycardia. Continue lisinopril. 2-D echo with EF of 50-55%, no wall motion abnormality and unable to detect diastolic dysfunction.  Active Problems: Atrial fibrillation/flutter Status post ablation for AVNRT.  CHADS2vasc of 3. continue Eliquis  Bradycardia Heart rate dropped to 20s yesterday while asleep. Metoprolol dose reduced to 12.5 mg twice a day. Has occasional heart rate in high 30s. Will monitor closely  Hypokalemia Replenish  COPD Stable. Continue home inhalers.  morbid obesity Needs counseling on diet and exercise.  Tobacco abuse Counseled on cessation. Nicotine patch.     Code Status : full code  Family Communication  : wife at bedside  Disposition Plan  :  home pending clinical improvement possibly the next 3-4 days when adequately diuresed  Barriers For Discharge : active symptoms  Consults  :  cardiology  Procedures   2-D echo  DVT Prophylaxis  : eliquis  Lab Results  Component Value Date   PLT 178 09/09/2017    Antibiotics  :    Anti-infectives    None        Objective:   Vitals:   09/09/17 2136 09/10/17 0432 09/10/17 1256 09/10/17 1306  BP: 126/60 (!) 141/73  123/67  Pulse: 69 62  90  Resp: Temp: 98.6 F (37 C) (!) 97.5 F (36.4 C)  98.1 F (36.7 C)  TempSrc: Oral Oral  Oral  SpO2: 96% 100% 94% 98%  Weight:  (!) 147.2 kg (324 lb 8.3 oz)    Height:        Wt Readings from Last 3 Encounters:  09/10/17 (!) 147.2 kg (324 lb 8.3 oz)  09/07/17 (!) 144.7 kg (319 lb)  01/15/10 (!) 138.3 kg (305 lb)     Intake/Output Summary (Last 24 hours) at 09/10/17 1358 Last data filed at 09/10/17 1259  Gross per 24 hour  Intake              720 ml  Output             4470 ml  Net            -3750 ml     Physical Exam  Gen: not in distress HEENT: , moist mucosa, supple neck Chest: few scattered rhonchi CVS: N S1&S2, no murmurs, rubs or gallop GI: soft, NT, ND,  Musculoskeletal: 3+ tense pitting edema bilaterally     Data Review:    CBC  Recent Labs Lab 09/07/17 1527 09/08/17 0108 09/09/17 0534  WBC 7.9 7.7 8.3  HGB 13.7 12.6* 12.7*  HCT 42.4 39.5 40.7  PLT 181 177 178  MCV 98.4 98.3 100.0  MCH 31.8 31.3 31.2    MCHC 32.3 31.9 31.2  RDW 16.0* 16.0* 16.4*  LYMPHSABS  --  2.4  --   MONOABS  --  1.2*  --   EOSABS  --  0.2  --   BASOSABS  --  0.1  --     Chemistries   Recent Labs Lab 09/07/17 1527 09/07/17 1919 09/08/17 0108 09/09/17 0534 09/10/17 0733  NA 137  --  138 138 136  K 2.6*  --  3.2* 3.8 3.4*  CL 96*  --  97* 96* 91*  CO2 32  --  31 32 34*  GLUCOSE 135*  --  94 90 94  BUN 12  --  CREATININE 0.97  --  0.93 1.17 1.09  CALCIUM 9.1  --  8.9 9.0 9.4  MG  --  2.0  --   --   --    ------------------------------------------------------------------------------------------------------------------ No results for input(s): CHOL, HDL, LDLCALC, TRIG, CHOLHDL, LDLDIRECT in the last 72 hours.  No results found for: HGBA1C ------------------------------------------------------------------------------------------------------------------  Recent Labs  09/07/17 1527  TSH 1.345   ------------------------------------------------------------------------------------------------------------------ No results for input(s): VITAMINB12, FOLATE, FERRITIN, TIBC, IRON, RETICCTPCT in the last 72 hours.  Coagulation profile  Recent Labs Lab 09/07/17 1919  INR 1.11    No results for input(s): DDIMER in the last 72 hours.  Cardiac Enzymes  Recent Labs Lab 09/07/17 1919 09/08/17 0108 09/08/17 0706  TROPONINI 0.09* 0.15* 0.12*   ------------------------------------------------------------------------------------------------------------------    Component Value Date/Time  BNP 747.0 (H) 09/07/2017 1527    Inpatient Medications  Scheduled Meds: . apixaban  5 mg Oral BID  . citalopram  20 mg Oral q morning - 10a  . famotidine  40 mg Oral QHS  . furosemide  80 mg Intravenous Q12H  . gabapentin  300 mg Oral TID  . lisinopril  5 mg Oral Daily  . LORazepam  1 mg Oral Q8H  . metoprolol tartrate  12.5 mg Oral BID  . nicotine  14 mg Transdermal Daily  . potassium chloride  40  mEq Oral BID  . sodium chloride flush  3 mL Intravenous Q12H  . umeclidinium bromide  1 puff Inhalation Daily   Continuous Infusions: . sodium chloride     PRN Meds:.sodium chloride, acetaminophen, albuterol, ondansetron (ZOFRAN) IV, sodium chloride flush  Micro Results No results found for this or any previous visit (from the past 240 hour(s)).  Radiology Reports Mr Cervical Spine W Wo Contrast  Result Date: 08/11/2017 CLINICAL DATA:  Initial evaluation for cervical myelopathy. EXAM: MRI CERVICAL SPINE WITHOUT AND WITH CONTRAST TECHNIQUE: Multiplanar and multiecho pulse sequences of the cervical spine, to include the craniocervical junction and cervicothoracic junction, were obtained without and with intravenous contrast. CONTRAST:  20mL MULTIHANCE GADOBENATE DIMEGLUMINE 529 MG/ML IV SOLN COMPARISON:  None available. FINDINGS: Alignment: Study degraded by motion artifact. Straightening with slight reversal of the normal cervical lordosis. Trace anterolisthesis of C3 on C4. Vertebrae: Patient is status post fusion at C6-7. There appears to be complete osseous fusion at this level. Vertebral body heights maintained. No evidence for acute or chronic fracture. Signal intensity within the vertebral body bone marrow within normal limits. No discrete or worrisome osseous lesions. No abnormal enhancement. Cord: Subtle 4 mm focus of T2 hyperintensity within the right dorsal aspect of the cervical spinal cord at C6, suspicious for chronic myelomalacia (series 8, image 20). Signal intensity within the cervical spinal cord otherwise within normal limits. Posterior Fossa, vertebral arteries, paraspinal tissues: Visualized brain and posterior fossa within normal limits. Craniocervical junction normal. Paraspinous soft tissues within normal limits. Normal intravascular flow voids within the vertebral artery's noted. Disc levels: C2-C3: No significant disc bulge. Left-sided uncovertebral hypertrophy. Prominent  left-sided facet degeneration. Resultant moderate left C3 foraminal stenosis. No significant canal or right foraminal narrowing. C3-C4: Diffuse disc bulge with bilateral uncovertebral spurring, right greater than left. Prominent right-sided facet degeneration with more mild left-sided facet arthropathy. Bulging disc flattens and partially faces the ventral thecal sac results in mild spinal stenosis. Severe bilateral C4 foraminal stenosis, right worse than left. C4-C5: Diffuse degenerative disc osteophyte with mild intervertebral disc space narrowing. Broad posterior component flattens and partially faces the ventral thecal sac. Slight caudad migration of disc material noted (series 11, image 7). Superimposed ligamentum flavum thickening. Moderate spinal stenosis. Superimposed left greater than right facet degeneration. Severe bilateral C5 foraminal stenosis, left worse than right. C5-C6: Broad central disc protrusion flattens and effaces the ventral thecal sac. Superimposed ligamentum flavum thickening. Secondary cord flattening with probable small focus of myelomalacia just below with the level C6 as above. Severe canal stenosis with the thecal sac measuring 6 mm in AP diameter. Severe bilateral C6 foraminal narrowing related to disc disc bulge and uncovertebral disease. C6-C7: Status post fusion. No residual canal stenosis. Residual left-sided uncovertebral spurring with resultant moderate left C7 foraminal stenosis. C7-T1: Central disc protrusion indents the ventral thecal sac. Bilateral facet hypertrophy. Mild spinal stenosis. Mild right C8 foraminal stenosis. Visualized upper thoracic spine within normal  limits. IMPRESSION: 1. Status post solid osseous fusion at C6-7 without canal stenosis. Residual left-sided uncovertebral disease results in moderate left C7 foraminal stenosis. 2. Adjacent segment disease with at C5-6 with resultant severe canal and bilateral foraminal stenosis. 3. Degenerative disc osteophyte  at C4-5 with resultant moderate canal and severe bilateral C5 foraminal stenosis as above. 4. Severe bilateral C4 foraminal stenosis related to right worse than left uncovertebral and facet disease. 5. Left-sided uncovertebral and facet disease at C2-3 with resultant moderate left C3 foraminal stenosis. Electronically Signed   By: Rise Mu M.D.   On: 08/11/2017 21:34   Dg Chest Portable 1 View  Result Date: 09/07/2017 CLINICAL DATA:  Fluid retention, shortness of breath, and irregular heartbeat. Current smoker. History of COPD, cardiac ablation, morbid obesity. EXAM: PORTABLE CHEST 1 VIEW COMPARISON:  Chest x-ray of May 23, 2017 FINDINGS: The cardiopericardial silhouette remains enlarged. The pulmonary vascularity is engorged and there is mild cephalization. The interstitial markings are both lungs are mildly increased. There is no pleural effusion. There is calcification in the wall of the aortic arch. The observed bony thorax is unremarkable. IMPRESSION: CHF with mild interstitial edema.  No alveolar pneumonia. Thoracic aortic atherosclerosis. Electronically Signed   By: David  [place] M.D.   On: 09/07/2017 15:53    Time Spent in minutes  35   Eddie North M.D on 09/10/2017 at 1:58 PM  Between 7am to 7pm - Pager - [phone]  After 7pm go to [email or web address] - password [facility]  [facility] -  Office  [phone]

## 2017-09-11 LAB — CBC
HCT: 42.5 % (ref 39.0–52.0)
Hemoglobin: 13.4 g/dL (ref 13.0–17.0)
MCH: 31.4 pg (ref 26.0–34.0)
MCHC: 31.5 g/dL (ref 30.0–36.0)
MCV: 99.5 fL (ref 78.0–100.0)
PLATELETS: 184 10*3/uL (ref 150–400)
RBC: 4.27 MIL/uL (ref 4.22–5.81)
RDW: 16 % — AB (ref 11.5–15.5)
WBC: 8.9 10*3/uL (ref 4.0–10.5)

## 2017-09-11 LAB — BASIC METABOLIC PANEL
Anion gap: 10 (ref 5–15)
BUN: 27 mg/dL — AB (ref 6–20)
CO2: 34 mmol/L — ABNORMAL HIGH (ref 22–32)
CREATININE: 1.35 mg/dL — AB (ref 0.61–1.24)
Calcium: 9.4 mg/dL (ref 8.9–10.3)
Chloride: 92 mmol/L — ABNORMAL LOW (ref 101–111)
GFR calc Af Amer: >60 mL/min (ref >60)
GFR, EST NON AFRICAN AMERICAN: 53 mL/min — AB (ref >60)
Glucose, Bld: 94 mg/dL (ref 65–99)
Potassium: 4.4 mmol/L (ref 3.5–5.1)
SODIUM: 136 mmol/L (ref 135–145)

## 2017-09-11 LAB — MAGNESIUM: MAGNESIUM: 2.3 mg/dL (ref 1.7–2.4)

## 2017-09-11 NOTE — Progress Notes (Signed)
Patient's family were wondering if the patient should be taken off metoprolol completely or either reduce his dose even lower.

## 2017-09-11 NOTE — Progress Notes (Signed)
PROGRESS NOTE                                                                                                                                                                                                             Patient Demographics:    Edward Andrews, is a 66 y.o. male, DOB - 1951-05-30, ZOX:[id number]  Admit date - 09/07/2017   Admitting Physician Jonah Blue, MD  Outpatient Primary MD for the patient is Toma Deiters, MD  LOS - 4  Outpatient Specialists: cardiology  Chief Complaint  Patient presents with  . Groin Swelling  . Leg Swelling       Brief Narrative   66 year old male with history of anxiety, depression, OSA not on CPAP, COPD, hyperlipidemia, history of AVNRT status post ablation in 2010 and peripheral artery disease presented from cardiology office with acute CHF exacerbation. Patient reported increased swelling of his legs associated with shortness of breath and weakness.symptoms have been ongoing for 6-8 weeks and worsening for the past 1 week.reports 2 pillow orthopnea but no PND. Denied any chest pain. Has chronic constipation. Patient was seen in cardiology office for cardiac clearance for upcoming back surgery on 10/19.     Subjective:   Breathing continues to improve.Weight down significantly. Heart rate occasionally going down to 40s and then comes up to 110s.   Assessment  & Plan :    Principal Problem: Acute on chronic combined systolic and diastolic CHF. Noticed to be over by almost 30 pounds and has diuresed significantly (weight of by about 9 pounds from baseline of 295 pounds this morning)  Continue IV Lasix 80 mg twice a day and has good diuresis (-10L since admission). Received her dose metolazone on 10/12. Strict I/O and daily weight. Keep legs elevated at all times. Beta blocker dose reduced due to bradycardia. Continue lisinopril. 2-D echo with EF of 50-55%, no wall motion  abnormality and unable to detect diastolic dysfunction.  Cardiology following.  Active Problems: Atrial fibrillation/flutter Status post ablation for AVNRT. CHADS2vasc of 3. continue Eliquis  Bradycardia Heart rate dropped to 20s on 10/12 while asleep. Metoprolol dose reduced to 12.5 mg twice a day. Heart rate improved, occasionally >110. Will continue current oral metoprolol.  Hypokalemia Replenished.magnesium normal.  Acute kidney injury Mild possibly with diuresis. Continue current dose of IV Lasix and monitor labs  daily.  COPD Stable. Continue home inhalers.  morbid obesity Needs counseling on diet and exercise.  Tobacco abuse Smokes one pack per day.Counseled on cessation. Nicotine patch.     Code Status : full code  Family Communication  : none at bedside  Disposition Plan  : home pending clinical improvement possibly the next 3-4 days when adequately diuresed  Barriers For Discharge : active symptoms  Consults  :  cardiology  Procedures   2-D echo  DVT Prophylaxis  : eliquis  Lab Results  Component Value Date   PLT 184 09/11/2017    Antibiotics  :    Anti-infectives    None        Objective:   Vitals:   09/10/17 2129 09/11/17 0441 09/11/17 0852 09/11/17 0935  BP: 126/68 125/72    Pulse: 66 65    Resp: 20 18    Temp: 98 F (36.7 C) (!) 97.3 F (36.3 C)    TempSrc: Oral Oral    SpO2: 99% 99% 96%   Weight:    (!) 137.6 kg (303 lb 4.8 oz)  Height:        Wt Readings from Last 3 Encounters:  09/11/17 (!) 137.6 kg (303 lb 4.8 oz)  09/07/17 (!) 144.7 kg (319 lb)  01/15/10 (!) 138.3 kg (305 lb)     Intake/Output Summary (Last 24 hours) at 09/11/17 1217 Last data filed at 09/11/17 0900  Gross per 24 hour  Intake              480 ml  Output             3070 ml  Net            -2590 ml     Physical Exam General: Elderly male not in distress   HEENT: Moist mucosa, supple neck Chest: Improved breath sounds bilaterally CVS: Normal  S1 and S2, no murmurs neck slight jet: Soft, nontender, nondistended musculoskeletal: 3+ tense pitting edema bilaterally (some improvement from yesterday)      Data Review:    CBC  Recent Labs Lab 09/07/17 1527 09/08/17 0108 09/09/17 0534 09/11/17 0553  WBC 7.9 7.7 8.3 8.9  HGB 13.7 12.6* 12.7* 13.4  HCT 42.4 39.5 40.7 42.5  PLT 181 177 178 184  MCV 98.4 98.3 100.0 99.5  MCH 31.8 31.3 31.2 31.4  MCHC 32.3 31.9 31.2 31.5  RDW 16.0* 16.0* 16.4* 16.0*  LYMPHSABS  --  2.4  --   --   MONOABS  --  1.2*  --   --   EOSABS  --  0.2  --   --   BASOSABS  --  0.1  --   --     Chemistries   Recent Labs Lab 09/07/17 1527 09/07/17 1919 09/08/17 0108 09/09/17 0534 09/10/17 0733 09/11/17 0553  NA 137  --  138 138 136 136  K 2.6*  --  3.2* 3.8 3.4* 4.4  CL 96*  --  97* 96* 91* 92*  CO2 32  --  31 32 34* 34*  GLUCOSE 135*  --  94 90 94 94  BUN 12  --  27*  CREATININE 0.97  --  0.93 1.17 1.09 1.35*  CALCIUM 9.1  --  8.9 9.0 9.4 9.4  MG  --  2.0  --   --   --  2.3   ------------------------------------------------------------------------------------------------------------------ No results for input(s): CHOL, HDL, LDLCALC, TRIG, CHOLHDL, LDLDIRECT in the last 72 hours.  No results found for: HGBA1C ------------------------------------------------------------------------------------------------------------------ No results for input(s): TSH, T4TOTAL, T3FREE, THYROIDAB in the last 72 hours.  Invalid input(s): FREET3 ------------------------------------------------------------------------------------------------------------------ No results for input(s): VITAMINB12, FOLATE, FERRITIN, TIBC, IRON, RETICCTPCT in the last 72 hours.  Coagulation profile  Recent Labs Lab 09/07/17 1919  INR 1.11    No results for input(s): DDIMER in the last 72 hours.  Cardiac Enzymes  Recent Labs Lab 09/07/17 1919 09/08/17 0108 09/08/17 0706  TROPONINI 0.09* 0.15* 0.12*    ------------------------------------------------------------------------------------------------------------------    Component Value Date/Time   BNP 747.0 (H) 09/07/2017 1527    Inpatient Medications  Scheduled Meds: . apixaban  5 mg Oral BID  . citalopram  20 mg Oral q morning - 10a  . famotidine  40 mg Oral QHS  . furosemide  80 mg Intravenous Q12H  . gabapentin  300 mg Oral TID  . lisinopril  5 mg Oral Daily  . LORazepam  1 mg Oral Q8H  . metoprolol tartrate  12.5 mg Oral BID  . nicotine  14 mg Transdermal Daily  . potassium chloride  40 mEq Oral BID  . sodium chloride flush  3 mL Intravenous Q12H  . umeclidinium bromide  1 puff Inhalation Daily   Continuous Infusions: . sodium chloride     PRN Meds:.sodium chloride, acetaminophen, albuterol, ondansetron (ZOFRAN) IV, sodium chloride flush  Micro Results No results found for this or any previous visit (from the past 240 hour(s)).  Radiology Reports Dg Chest Portable 1 View  Result Date: 09/07/2017 CLINICAL DATA:  Fluid retention, shortness of breath, and irregular heartbeat. Current smoker. History of COPD, cardiac ablation, morbid obesity. EXAM: PORTABLE CHEST 1 VIEW COMPARISON:  Chest x-ray of May 23, 2017 FINDINGS: The cardiopericardial silhouette remains enlarged. The pulmonary vascularity is engorged and there is mild cephalization. The interstitial markings are both lungs are mildly increased. There is no pleural effusion. There is calcification in the wall of the aortic arch. The observed bony thorax is unremarkable. IMPRESSION: CHF with mild interstitial edema.  No alveolar pneumonia. Thoracic aortic atherosclerosis. Electronically Signed   By: David  [place] M.D.   On: 09/07/2017 15:53    Time Spent in minutes  25   Eddie North M.D on 09/11/2017 at 12:17 PM  Between 7am to 7pm - Pager - [phone]  After 7pm go to [email or web address] - password [facility]  [facility] -  Office  [phone]

## 2017-09-12 DIAGNOSIS — N289 Disorder of kidney and ureter, unspecified: Secondary | ICD-10-CM

## 2017-09-12 DIAGNOSIS — G4733 Obstructive sleep apnea (adult) (pediatric): Secondary | ICD-10-CM

## 2017-09-12 DIAGNOSIS — I483 Typical atrial flutter: Secondary | ICD-10-CM

## 2017-09-12 LAB — BASIC METABOLIC PANEL
ANION GAP: 10 (ref 5–15)
BUN: 41 mg/dL — AB (ref 6–20)
CALCIUM: 9.1 mg/dL (ref 8.9–10.3)
CO2: 33 mmol/L — AB (ref 22–32)
Chloride: 91 mmol/L — ABNORMAL LOW (ref 101–111)
Creatinine, Ser: 1.98 mg/dL — ABNORMAL HIGH (ref 0.61–1.24)
GFR calc Af Amer: 38 mL/min — ABNORMAL LOW (ref >60)
GFR, EST NON AFRICAN AMERICAN: 33 mL/min — AB (ref >60)
GLUCOSE: 88 mg/dL (ref 65–99)
Potassium: 4.6 mmol/L (ref 3.5–5.1)
Sodium: 134 mmol/L — ABNORMAL LOW (ref 135–145)

## 2017-09-12 MED ORDER — FUROSEMIDE 10 MG/ML IJ SOLN
4.0000 mg/h | INTRAMUSCULAR | Status: DC
  Administered 2017-09-12 – 2017-09-15 (×2): 4 mg/h via INTRAVENOUS
  Filled 2017-09-12 (×2): qty 25

## 2017-09-12 NOTE — Progress Notes (Signed)
Progress Note  Patient Name: Edward Andrews Date of Encounter: 09/12/2017  Primary Cardiologist: Dr. Prentice Docker  Subjective   Sitting in bedside chair. No chest pain or palpitations. Slowly improving leg edema. No abdominal pain, nausea or emesis.  Inpatient Medications    Scheduled Meds: . apixaban  5 mg Oral BID  . citalopram  20 mg Oral q morning - 10a  . famotidine  40 mg Oral QHS  . furosemide  80 mg Intravenous Q12H  . gabapentin  300 mg Oral TID  . lisinopril  5 mg Oral Daily  . LORazepam  1 mg Oral Q8H  . metoprolol tartrate  12.5 mg Oral BID  . nicotine  14 mg Transdermal Daily  . potassium chloride  40 mEq Oral BID  . sodium chloride flush  3 mL Intravenous Q12H  . umeclidinium bromide  1 puff Inhalation Daily   Continuous Infusions: . sodium chloride     PRN Meds: sodium chloride, acetaminophen, albuterol, ondansetron (ZOFRAN) IV, sodium chloride flush   Vital Signs    Vitals:   09/11/17 1615 09/11/17 2037 09/12/17 0554 09/12/17 0802  BP: 110/65 (!) 121/57 (!) 82/47   Pulse:  (!) 51 72   Resp:  16 16   Temp:  98.4 F (36.9 C) 97.7 F (36.5 C)   TempSrc:  Oral Oral   SpO2:  95% 96% 93%  Weight:      Height:        Intake/Output Summary (Last 24 hours) at 09/12/17 0843 Last data filed at 09/11/17 2037  Gross per 24 hour  Intake              720 ml  Output             2050 ml  Net            -1330 ml   Filed Weights   09/09/17 0502 09/10/17 0432 09/11/17 0935  Weight: (!) 325 lb 13.8 oz (147.8 kg) (!) 324 lb 8.3 oz (147.2 kg) (!) 303 lb 4.8 oz (137.6 kg)    Telemetry    Typical atrial flutter with variable conduction. Personally reviewed.  ECG    Tracing from 09/07/2017 shows typical atrial flutter with variable conduction and nonspecific ST changes. Personally reviewed.  Physical Exam   GEN: Morbidly obese male.No acute distress.   Neck: No JVD. Cardiac: Irregularly irregular, soft systolic murmur, no gallop.  Respiratory: No  wheezing. Clear to auscultation bilaterally. GI: Obese, nontender, bowel sounds present. MS: 3+ bilateral leg edema; No deformity. Neuro:  Nonfocal. Psych: Alert and oriented x 3. Normal affect.  Labs    Chemistry Recent Labs Lab 09/10/17 0733 09/11/17 0553 09/12/17 0650  NA 136 136 134*  K 3.4* 4.4 4.6  CL 91* 92* 91*  CO2 34* 34* 33*  GLUCOSE 94 94 88  BUN 19 27* 41*  CREATININE 1.09 1.35* 1.98*  CALCIUM 9.4 9.4 9.1  GFRNONAA >60 53* 33*  GFRAA >60 >60 38*  ANIONGAP Hematology Recent Labs Lab 09/08/17 0108 09/09/17 0534 09/11/17 0553  WBC 7.7 8.3 8.9  RBC 4.02* 4.07* 4.27  HGB 12.6* 12.7* 13.4  HCT 39.5 40.7 42.5  MCV 98.3 100.0 99.5  MCH 31.3 31.2 31.4  MCHC 31.9 31.2 31.5  RDW 16.0* 16.4* 16.0*  PLT 177 178 184    Cardiac Enzymes Recent Labs Lab 09/07/17 1919 09/08/17 0108 09/08/17 0706  TROPONINI 0.09* 0.15* 0.12*  Recent Labs Lab 09/07/17 1534  TROPIPOC 0.09*     BNP Recent Labs Lab 09/07/17 1527  BNP 747.0*     Radiology    Chest x-ray 09/07/2017: FINDINGS: The cardiopericardial silhouette remains enlarged. The pulmonary vascularity is engorged and there is mild cephalization. The interstitial markings are both lungs are mildly increased. There is no pleural effusion. There is calcification in the wall of the aortic arch. The observed bony thorax is unremarkable.  IMPRESSION: CHF with mild interstitial edema.  No alveolar pneumonia.  Thoracic aortic atherosclerosis.  Cardiac Studies   Echocardiogram 09/08/2017: Study Conclusions  - Left ventricle: The cavity size was normal. Systolic function was   normal. The estimated ejection fraction was in the range of 50%   to 55%. Wall motion was normal; there were no regional wall   motion abnormalities. The study is not technically sufficient to   allow evaluation of LV diastolic function. - Mitral valve: Calcified annulus. Mildly thickened leaflets .   There  was mild regurgitation. - Atrial septum: No defect or patent foramen ovale was identified. - Pulmonary arteries: PA peak pressure: 41 mm Hg (S).  Patient Profile     66 y.o. male with a history of nonobstructive CAD, PSVT status post ablation, hyperlipidemia, COPD, OSA, PAD with previous right common iliac stenting, now admitted to the hospital with acute on chronic diastolic heart failure as well as typical atrial flutter.  Assessment & Plan    1. Acute on chronic diastolic heart failure. LVEF 50-55% by recent echocardiogram. He has diuresed over 7000 cc so far on IV Lasix, however creatinine has bumped up over the weekend. Leg edema improving, although still with substantial edema present.  2. Typical atrial flutter with variable conduction, CHADSVASC score of 2-3. He is on Eliquis for stroke prophylaxis. Bradycardia and pauses have been noted, mainly nocturnal the setting of OSA, beta blocker dose has been reduced significantly with increase in heart rate trend.  3. Acute renal insufficiency,creatinine is increased from 0.93 up to 1.98. Potassium 4.6. This is in the setting of IV diuresis. Also with recent hypotension on lisinopril, question component of ATN.  4. History of OSA, also likely affecting nocturnal bradycardia.  I reviewed the consultation note from Dr. Eden Emms, recent office note from Dr. Purvis Sheffield. Would stop lisinopril at this point given recent hypotension and bump in creatinine. Agree with reduction in Lopressor to 12.5 mg twice daily for now, may need further adjustments. Suggest CPAP overnight for OSA to help reduce nocturnal bradycardia. Also convert from divided dose Lasix to Lasix drip, follow-up urine output and BMET in the a.m. Continue Eliquis for stroke prophylaxis.  Signed, Nona Dell, MD  09/12/2017, 8:43 AM

## 2017-09-12 NOTE — Progress Notes (Signed)
Physical Therapy Treatment Patient Details Name: Edward Andrews MRN: [id number] DOB: 04-18-1951 Today's Date: 09/12/2017    History of Present Illness Edward Andrews is a 66 y.o. male with medical history significant of PAD; anxiety/depression/panic; OSA not on CPAP; HLD; COPD; and h/o AVNRT s/p ablation in 2010 presenting from Dr. Junius Argyle office with apparent CHF exacerbation.  He complains of swelling, fluid, numbness, weakness.  Can't breathe, SOB.  Constipated.  Symptoms have been going on for 6-8 weeks.  Symptoms started right after his last PCP visit.  Today, he went to the cardiologist and he sent him to [facility] for admission.  SOB for the last week all the time, previously it was intermittent but not necessarily exertional.  Unable to walk around due to back pain.  Previously slept on 2 pillows but now sleeping in a recliner.  No PND.  No chest pain but he has had jaw pain, comes and goes, maybe 6 total times but very transient; he associated it with his neck.  Constipation is chronic, he is taking pain medication.  Linzess makes him have fecal urgency and he can't get to the bathroom soon enough so he doesn't take it much.  He was supposed to have back surgery on Oct 19 and he was sent to cardiology for cardiac clearance.    PT Comments    Patient presents up in chair and states he slept in chair over night.  Patient had much difficulty locking knees during sit to stands and unable to take steps due to BLE weakness also presents with severe swelling of RLE.  Patient will benefit from continued physical therapy in hospital and recommended venue below to increase strength, balance, endurance for safe ADLs and gait.   Follow Up Recommendations  SNF     Equipment Recommendations  None recommended by PT    Recommendations for Other Services       Precautions / Restrictions Precautions Precautions: Fall Restrictions Weight Bearing Restrictions: No    Mobility  Bed Mobility                General bed mobility comments: patient presents up in chair  Transfers Overall transfer level: Needs assistance Equipment used: Rolling walker (2 wheeled) Transfers: Sit to/from Stand Sit to Stand: Max assist         General transfer comment: Patient had difficulty locking knees due to BLE weakness  Ambulation/Gait                 Stairs            Wheelchair Mobility    Modified Rankin (Stroke Patients Only)       Balance Overall balance assessment: Needs assistance Sitting-balance support: Feet supported Sitting balance-Leahy Scale: Good     Standing balance support: Bilateral upper extremity supported;During functional activity Standing balance-Leahy Scale: Poor                              Cognition Arousal/Alertness: Awake/alert Behavior During Therapy: WFL for tasks assessed/performed Overall Cognitive Status: Within Functional Limits for tasks assessed                                        Exercises General Exercises - Lower Extremity Long Arc Quad: Seated;AROM;Both;Strengthening;10 reps Hip Flexion/Marching: Seated;AROM;Strengthening;Both;10 reps Toe Raises: Seated;AROM;Strengthening;Both;10 reps Heel Raises: Seated;AROM;Strengthening;Both;10 reps  General Comments        Pertinent Vitals/Pain Pain Assessment: No/denies pain    Home Living                      Prior Function            PT Goals (current goals can now be found in the care plan section) Acute Rehab PT Goals Patient Stated Goal: return home PT Goal Formulation: With patient Time For Goal Achievement: 09/16/17 Potential to Achieve Goals: Good Progress towards PT goals: Progressing toward goals    Frequency    Min 3X/week      PT Plan Current plan remains appropriate    Co-evaluation              AM-PAC PT "6 Clicks" Daily Activity  Outcome Measure  Difficulty turning over in bed (including  adjusting bedclothes, sheets and blankets)?: A Little Difficulty moving from lying on back to sitting on the side of the bed? : A Little Difficulty sitting down on and standing up from a chair with arms (e.g., wheelchair, bedside commode, etc,.)?: A Little Help needed moving to and from a bed to chair (including a wheelchair)?: A Lot Help needed walking in hospital room?: Total Help needed climbing 3-5 steps with a railing? : Total 6 Click Score: 13    End of Session Equipment Utilized During Treatment: Gait belt Activity Tolerance: Patient limited by fatigue Patient left: in chair;with call bell/phone within reach Nurse Communication: Mobility status PT Visit Diagnosis: Unsteadiness on feet (R26.81);Other abnormalities of gait and mobility (R26.89);Muscle weakness (generalized) (M62.81)     Time: 1610-9604 PT Time Calculation (min) (ACUTE ONLY): 27 min  Charges:  $Therapeutic Activity: 23-37 mins                    G Codes:       3:15 PM, 2017/09/19 Ocie Bob, MPT Physical Therapist with [facility] 336 [phone] office [phone] mobile phone

## 2017-09-12 NOTE — Progress Notes (Signed)
PROGRESS NOTE                                                                                                                                                                                                             Patient Demographics:    Edward Andrews, is a 66 y.o. male, DOB - [birth year], ZOX:[id number]  Admit date - 09/07/2017   Admitting Physician Jonah Blue, MD  Outpatient Primary MD for the patient is Toma Deiters, MD  LOS - 5  Outpatient Specialists: cardiology  Chief Complaint  Patient presents with  . Groin Swelling  . Leg Swelling       Brief Narrative   66 year old male with history of anxiety, depression, OSA not on CPAP, COPD, hyperlipidemia, history of AVNRT status post ablation in 2010 and peripheral artery disease presented from cardiology office with acute CHF exacerbation. Patient reported increased swelling of his legs associated with shortness of breath and weakness.symptoms have been ongoing for 6-8 weeks and worsening for the past 1 week.reports 2 pillow orthopnea but no PND. Denied any chest pain. Has chronic constipation. Patient was seen in cardiology office for cardiac clearance for upcoming back surgery on 10/19.     Subjective:   Breathing better but still has significant leg edema.   Assessment  & Plan :    Principal Problem: Acute on chronic combined systolic and diastolic CHF. Noticed to be over by almost 30 pounds and has diuresed significantly (weight of by about 9 pounds from baseline of 295 pounds this morning) Was receiving IV Lasix 80 mg twice a day, swish to Lasix drip given persistent leg swelling. Monitor strict I/O and daily weight. Monitor daily potassium and renal function. Beta blocker dose reduced due to bradycardia. Lisinopril discontinued due to history of present illness. 2-D echo with EF of 50-55%, no wall motion abnormality and unable to detect diastolic  dysfunction.  Cardiology consult appreciated.   Active Problems: Atrial fibrillation/flutter Status post ablation for AVNRT. CHADS2vasc of 3. continue Eliquis and lower dose BB.   Bradycardia Heart rate dropped to 20s on 10/12 while asleep. Metoprolol dose reduced to 12.5 mg twice a day. Heart rate improved, occasionally >110. Added nighttime CPAP for possible sleep apnea. He will need outpatient sleep study done.   Hypokalemia Replenished.magnesium normal.  Acute kidney injury Secondary to dehydration +/- cardiorenal  syndrome. Holding lisinopril. Monitor with aggressive dose of Lasix.   COPD Stable. Continue home inhalers.  morbid obesity / ?OSA Needs counseling on diet and exercise.nighttime CPAP while in the hospital.  Tobacco abuse Smokes one pack per day.Counseled on cessation. Nicotine patch.     Code Status : full code  Family Communication  : none at bedside  Disposition Plan  : home pending clinical improvement possibly the next 3-4 days when adequately diuresed  Barriers For Discharge : active symptoms  Consults  :  cardiology  Procedures   2-D echo  DVT Prophylaxis  : eliquis  Lab Results  Component Value Date   PLT 184 09/11/2017    Antibiotics  :    Anti-infectives    None        Objective:   Vitals:   09/11/17 1615 09/11/17 2037 09/12/17 0554 09/12/17 0802  BP: 110/65 (!) 121/57 (!) 82/47   Pulse:  (!) 51 72   Resp:  16 16   Temp:  98.4 F (36.9 C) 97.7 F (36.5 C)   TempSrc:  Oral Oral   SpO2:  95% 96% 93%  Weight:      Height:        Wt Readings from Last 3 Encounters:  09/11/17 (!) 137.6 kg (303 lb 4.8 oz)  09/07/17 (!) 144.7 kg (319 lb)  01/15/10 (!) 138.3 kg (305 lb)     Intake/Output Summary (Last 24 hours) at 09/12/17 1044 Last data filed at 09/11/17 2037  Gross per 24 hour  Intake              480 ml  Output             1650 ml  Net            -1170 ml     Physical Exam Gen.: Elderly male not in  distress  HEENT: Moist mucosa, supple neck Chest: Diminished bibasilar breath sounds CVS: S1 and S2 irregular, no murmurs GI: Soft, nondistended, nontender Musculoskeletal: 3+ tense pitting edema bilaterally (unchanged from yesterday)       Data Review:    CBC  Recent Labs Lab 09/07/17 1527 09/08/17 0108 09/09/17 0534 09/11/17 0553  WBC 7.9 7.7 8.3 8.9  HGB 13.7 12.6* 12.7* 13.4  HCT 42.4 39.5 40.7 42.5  PLT 181 177 178 184  MCV 98.4 98.3 100.0 99.5  MCH 31.8 31.3 31.2 31.4  MCHC 32.3 31.9 31.2 31.5  RDW 16.0* 16.0* 16.4* 16.0*  LYMPHSABS  --  2.4  --   --   MONOABS  --  1.2*  --   --   EOSABS  --  0.2  --   --   BASOSABS  --  0.1  --   --     Chemistries   Recent Labs Lab 09/07/17 1919 09/08/17 0108 09/09/17 0534 09/10/17 0733 09/11/17 0553 09/12/17 0650  NA  --  138 138 136 136 134*  K  --  3.2* 3.8 3.4* 4.4 4.6  CL  --  97* 96* 91* 92* 91*  CO2  --  31 32 34* 34* 33*  GLUCOSE  --  94 90 94 94 88  BUN  --  27* 41*  CREATININE  --  0.93 1.17 1.09 1.35* 1.98*  CALCIUM  --  8.9 9.0 9.4 9.4 9.1  MG 2.0  --   --   --  2.3  --    ------------------------------------------------------------------------------------------------------------------ No results for input(s): CHOL, HDL,  LDLCALC, TRIG, CHOLHDL, LDLDIRECT in the last 72 hours.  No results found for: HGBA1C ------------------------------------------------------------------------------------------------------------------ No results for input(s): TSH, T4TOTAL, T3FREE, THYROIDAB in the last 72 hours.  Invalid input(s): FREET3 ------------------------------------------------------------------------------------------------------------------ No results for input(s): VITAMINB12, FOLATE, FERRITIN, TIBC, IRON, RETICCTPCT in the last 72 hours.  Coagulation profile  Recent Labs Lab 09/07/17 1919  INR 1.11    No results for input(s): DDIMER in the last 72 hours.  Cardiac Enzymes  Recent  Labs Lab 09/07/17 1919 09/08/17 0108 09/08/17 0706  TROPONINI 0.09* 0.15* 0.12*   ------------------------------------------------------------------------------------------------------------------    Component Value Date/Time   BNP 747.0 (H) 09/07/2017 1527    Inpatient Medications  Scheduled Meds: . apixaban  5 mg Oral BID  . citalopram  20 mg Oral q morning - 10a  . famotidine  40 mg Oral QHS  . gabapentin  300 mg Oral TID  . LORazepam  1 mg Oral Q8H  . metoprolol tartrate  12.5 mg Oral BID  . nicotine  14 mg Transdermal Daily  . potassium chloride  40 mEq Oral BID  . sodium chloride flush  3 mL Intravenous Q12H  . umeclidinium bromide  1 puff Inhalation Daily   Continuous Infusions: . sodium chloride    . furosemide (LASIX) infusion 4 mg/hr (09/12/17 1044)   PRN Meds:.sodium chloride, acetaminophen, albuterol, ondansetron (ZOFRAN) IV, sodium chloride flush  Micro Results No results found for this or any previous visit (from the past 240 hour(s)).  Radiology Reports Dg Chest Portable 1 View  Result Date: 09/07/2017 CLINICAL DATA:  Fluid retention, shortness of breath, and irregular heartbeat. Current smoker. History of COPD, cardiac ablation, morbid obesity. EXAM: PORTABLE CHEST 1 VIEW COMPARISON:  Chest x-ray of May 23, 2017 FINDINGS: The cardiopericardial silhouette remains enlarged. The pulmonary vascularity is engorged and there is mild cephalization. The interstitial markings are both lungs are mildly increased. There is no pleural effusion. There is calcification in the wall of the aortic arch. The observed bony thorax is unremarkable. IMPRESSION: CHF with mild interstitial edema.  No alveolar pneumonia. Thoracic aortic atherosclerosis. Electronically Signed   By: David  [place] M.D.   On: 09/07/2017 15:53    Time Spent in minutes 35   Eddie North M.D on 09/12/2017 at 10:44 AM  Between 7am to 7pm - Pager - [phone]  After 7pm go to [email or web address] -  password [facility]  [facility] -  Office  [phone]

## 2017-09-13 DIAGNOSIS — N179 Acute kidney failure, unspecified: Secondary | ICD-10-CM | POA: Diagnosis not present

## 2017-09-13 DIAGNOSIS — I5043 Acute on chronic combined systolic (congestive) and diastolic (congestive) heart failure: Secondary | ICD-10-CM | POA: Diagnosis present

## 2017-09-13 LAB — BASIC METABOLIC PANEL
ANION GAP: 11 (ref 5–15)
BUN: 45 mg/dL — ABNORMAL HIGH (ref 6–20)
CALCIUM: 9.5 mg/dL (ref 8.9–10.3)
CHLORIDE: 92 mmol/L — AB (ref 101–111)
CO2: 31 mmol/L (ref 22–32)
Creatinine, Ser: 1.74 mg/dL — ABNORMAL HIGH (ref 0.61–1.24)
GFR calc non Af Amer: 39 mL/min — ABNORMAL LOW (ref >60)
GFR, EST AFRICAN AMERICAN: 45 mL/min — AB (ref >60)
Glucose, Bld: 97 mg/dL (ref 65–99)
Potassium: 4.9 mmol/L (ref 3.5–5.1)
Sodium: 134 mmol/L — ABNORMAL LOW (ref 135–145)

## 2017-09-13 LAB — CBC
HEMATOCRIT: 41.9 % (ref 39.0–52.0)
HEMOGLOBIN: 13.6 g/dL (ref 13.0–17.0)
MCH: 31.9 pg (ref 26.0–34.0)
MCHC: 32.5 g/dL (ref 30.0–36.0)
MCV: 98.1 fL (ref 78.0–100.0)
Platelets: 210 10*3/uL (ref 150–400)
RBC: 4.27 MIL/uL (ref 4.22–5.81)
RDW: 15.9 % — ABNORMAL HIGH (ref 11.5–15.5)
WBC: 9.6 10*3/uL (ref 4.0–10.5)

## 2017-09-13 NOTE — Progress Notes (Signed)
Physical Therapy Treatment Patient Details Name: Edward Andrews MRN: [id number] DOB: 1951/09/08 Today's Date: 09/13/2017    History of Present Illness Edward Andrews is a 66 y.o. male with medical history significant of PAD; anxiety/depression/panic; OSA not on CPAP; HLD; COPD; and h/o AVNRT s/p ablation in 2010 presenting from Dr. Junius Argyle office with apparent CHF exacerbation.  He complains of swelling, fluid, numbness, weakness.  Can't breathe, SOB.  Constipated.  Symptoms have been going on for 6-8 weeks.  Symptoms started right after his last PCP visit.  Today, he went to the cardiologist and he sent him to [facility] for admission.  SOB for the last week all the time, previously it was intermittent but not necessarily exertional.  Unable to walk around due to back pain.  Previously slept on 2 pillows but now sleeping in a recliner.  No PND.  No chest pain but he has had jaw pain, comes and goes, maybe 6 total times but very transient; he associated it with his neck.  Constipation is chronic, he is taking pain medication.  Linzess makes him have fecal urgency and he can't get to the bathroom soon enough so he doesn't take it much.  He was supposed to have back surgery on Oct 19 and he was sent to cardiology for cardiac clearance.    PT Comments    Patient presents up in chair and now in chair for 2 days.  Patient able to stand with RW x 3 trials, but unable to take steps to transfer to bed due to unable to move RLE buckling of knees.  Patient required stand pivot transfer to transfer back to bed to be weighed by RN.  Patient will benefit from continued physical therapy in hospital and recommended venue below to increase strength, balance, endurance for safe ADLs and gait.   Follow Up Recommendations  SNF     Equipment Recommendations  None recommended by PT    Recommendations for Other Services       Precautions / Restrictions Precautions Precautions: Fall Precaution Comments: right toe drag  due to limited ankle dorsiflexion Restrictions Weight Bearing Restrictions: No    Mobility  Bed Mobility               General bed mobility comments: patient presents up in chair  Transfers Overall transfer level: Needs assistance Equipment used: Rolling walker (2 wheeled) Transfers: Sit to/from [employer] Transfers Sit to Stand: Max assist Stand pivot transfers: Max assist       General transfer comment: Patient had difficulty locking knees due to BLE weakness and required stand pivot transfer to put back to bed  Ambulation/Gait                 Stairs            Wheelchair Mobility    Modified Rankin (Stroke Patients Only)       Balance Overall balance assessment: Needs assistance Sitting-balance support: Feet supported Sitting balance-Leahy Scale: Good     Standing balance support: Bilateral upper extremity supported;During functional activity Standing balance-Leahy Scale: Poor                              Cognition Arousal/Alertness: Awake/alert Behavior During Therapy: WFL for tasks assessed/performed Overall Cognitive Status: Within Functional Limits for tasks assessed  Exercises      General Comments        Pertinent Vitals/Pain Pain Assessment: No/denies pain    Home Living                      Prior Function            PT Goals (current goals can now be found in the care plan section) Acute Rehab PT Goals Patient Stated Goal: return home PT Goal Formulation: With patient Time For Goal Achievement: 09/20/17 Potential to Achieve Goals: Good Progress towards PT goals: Progressing toward goals    Frequency    Min 3X/week      PT Plan Current plan remains appropriate    Co-evaluation              AM-PAC PT "6 Clicks" Daily Activity  Outcome Measure  Difficulty turning over in bed (including adjusting bedclothes, sheets and  blankets)?: A Little Difficulty moving from lying on back to sitting on the side of the bed? : A Little Difficulty sitting down on and standing up from a chair with arms (e.g., wheelchair, bedside commode, etc,.)?: A Lot Help needed moving to and from a bed to chair (including a wheelchair)?: A Lot Help needed walking in hospital room?: Total Help needed climbing 3-5 steps with a railing? : Total 6 Click Score: 12    End of Session Equipment Utilized During Treatment: Gait belt Activity Tolerance: Patient limited by fatigue Patient left: in bed;with call bell/phone within reach;with nursing/sitter in room Nurse Communication: Mobility status PT Visit Diagnosis: Unsteadiness on feet (R26.81);Other abnormalities of gait and mobility (R26.89);Muscle weakness (generalized) (M62.81)     Time: 4098-1191 PT Time Calculation (min) (ACUTE ONLY): 39 min  Charges:  $Therapeutic Activity: 38-52 mins                    G Codes:       4:02 PM, 06-Oct-2017 Ocie Bob, MPT Physical Therapist with [facility] 336 [phone] office [phone] mobile phone

## 2017-09-13 NOTE — Progress Notes (Signed)
Patient unable to stand for standing weight, due to weakness.  Bed zeroed for weight when returns to bed.  Nurse notified.

## 2017-09-13 NOTE — Progress Notes (Signed)
Progress Note  Patient Name: Edward Andrews Date of Encounter: 09/13/2017  Primary Cardiologist: Dr. Prentice Docker  Subjective   Short of breath with activity, but no chest pain or palpitations. Leg edema continues to slowly improve. Appetite stable. No abdominal pain.  Inpatient Medications    Scheduled Meds: . apixaban  5 mg Oral BID  . citalopram  20 mg Oral q morning - 10a  . famotidine  40 mg Oral QHS  . gabapentin  300 mg Oral TID  . LORazepam  1 mg Oral Q8H  . metoprolol tartrate  12.5 mg Oral BID  . nicotine  14 mg Transdermal Daily  . potassium chloride  40 mEq Oral BID  . sodium chloride flush  3 mL Intravenous Q12H  . umeclidinium bromide  1 puff Inhalation Daily   Continuous Infusions: . sodium chloride    . furosemide (LASIX) infusion 4 mg/hr (09/12/17 1044)   PRN Meds: sodium chloride, acetaminophen, albuterol, ondansetron (ZOFRAN) IV, sodium chloride flush   Vital Signs    Vitals:   09/12/17 0802 09/12/17 2158 09/13/17 0508 09/13/17 0802  BP:  122/67 (!) 136/58   Pulse:  (!) 49 (!) 42   Resp:  20 18   Temp:  98.4 F (36.9 C) 98.5 F (36.9 C)   TempSrc:  Oral Oral   SpO2: 93% 94% 95% 91%  Weight:      Height:        Intake/Output Summary (Last 24 hours) at 09/13/17 1022 Last data filed at 09/13/17 1020  Gross per 24 hour  Intake           319.54 ml  Output             3675 ml  Net         -3355.46 ml   Filed Weights   09/09/17 0502 09/10/17 0432 09/11/17 0935  Weight: (!) 325 lb 13.8 oz (147.8 kg) (!) 324 lb 8.3 oz (147.2 kg) (!) 303 lb 4.8 oz (137.6 kg)    Telemetry    Typical atrial flutter with variable conduction. Personally reviewed.  Physical Exam   GEN: Morbidly obese male. No acute distress.   Neck: No JVD. Cardiac:  Irregularly irregular, soft murmur, no gallop.  Respiratory:  No wheezing. GI:  Obese, bowel sounds present. MS:  3+ leg edema; No deformity. Neuro:  Nonfocal. Psych: Alert and oriented x 3. Normal  affect.  Labs    Chemistry Recent Labs Lab 09/11/17 0553 09/12/17 0650 09/13/17 0542  NA 136 134* 134*  K 4.4 4.6 4.9  CL 92* 91* 92*  CO2 34* 33* 31  GLUCOSE 94 88 97  BUN 27* 41* 45*  CREATININE 1.35* 1.98* 1.74*  CALCIUM 9.4 9.1 9.5  GFRNONAA 53* 33* 39*  GFRAA >60 38* 45*  ANIONGAP Hematology Recent Labs Lab 09/09/17 0534 09/11/17 0553 09/13/17 0542  WBC 8.3 8.9 9.6  RBC 4.07* 4.27 4.27  HGB 12.7* 13.4 13.6  HCT 40.7 42.5 41.9  MCV 100.0 99.5 98.1  MCH 31.2 31.4 31.9  MCHC 31.2 31.5 32.5  RDW 16.4* 16.0* 15.9*  PLT 178 184 210    Cardiac Enzymes Recent Labs Lab 09/07/17 1919 09/08/17 0108 09/08/17 0706  TROPONINI 0.09* 0.15* 0.12*    Recent Labs Lab 09/07/17 1534  TROPIPOC 0.09*     BNP Recent Labs Lab 09/07/17 1527  BNP 747.0*     Radiology    No results found.  Cardiac Studies   Echocardiogram 09/08/2017: Study Conclusions  - Left ventricle: The cavity size was normal. Systolic function was   normal. The estimated ejection fraction was in the range of 50%   to 55%. Wall motion was normal; there were no regional wall   motion abnormalities. The study is not technically sufficient to   allow evaluation of LV diastolic function. - Mitral valve: Calcified annulus. Mildly thickened leaflets .   There was mild regurgitation. - Atrial septum: No defect or patent foramen ovale was identified. - Pulmonary arteries: PA peak pressure: 41 mm Hg (S).  Patient Profile     66 y.o. male with a history of nonobstructive CAD, PSVT status post ablation, hyperlipidemia, COPD, OSA, PAD with previous right common iliac stenting, now admitted to the hospital with acute on chronic diastolic heart failure as well as typical atrial flutter.  Assessment & Plan    1. Acute on chronic diastolic heart failure. Patient was transitioned to Lasix drip yesterday, has had good diuresis with weight loss. Still has evidence of fluid overload.  2.  Typical atrial flutter with variable conduction, CHADSVASC score of 2-3. He continues on Eliquis for stroke prophylaxis. Maintained on low-dose beta blocker for heart rate control, limited by bradycardia.  3. Acute renal insufficiency, creatinine has decreased from 1.9-1.7. ACE inhibitor was discontinued.  4. OSA.  Plan to continue Lasix drip for now. Renal function is tolerating, in fact improvement in creatinine noted. Would continue Lopressor at 12.5 mg twice daily. Continue Eliquis for stroke prophylaxis. I suspect that he will eventually need a TEE guided cardioversion to get him back in sinus rhythm as this will help management of diastolic heart failure as well. He still has a significant amount of fluid to remove however.  Signed, Nona Dell, MD  09/13/2017, 10:22 AM

## 2017-09-13 NOTE — Clinical Social Work Note (Signed)
Patient declines SNF placement at this time. Patient states that he wants to try at home. CM aware of patient's needing HHPT services.  LCSW signing off.    Amazing Cowman, Juleen China, LCSW

## 2017-09-13 NOTE — Care Management Note (Signed)
Case Management Note  Patient Details  Name: Edward Andrews MRN: [id number] Date of Birth: 1951/06/28  If discussed at Long Length of Stay Meetings, dates discussed:  09/13/2017  Malcolm Metro, RN 09/13/2017, 2:00 PM

## 2017-09-13 NOTE — Progress Notes (Signed)
PROGRESS NOTE                                                                                                                                                                                                             Patient Demographics:    Edward Andrews, is a 66 y.o. male, DOB - Jul 11, 1951, ZOX:[id number]  Admit date - 09/07/2017   Admitting Physician Jonah Blue, MD  Outpatient Primary MD for the patient is Toma Deiters, MD  LOS - 6  Outpatient Specialists: cardiology  Chief Complaint  Patient presents with  . Groin Swelling  . Leg Swelling       Brief Narrative   66 year old male with history of anxiety, depression, OSA not on CPAP, COPD, hyperlipidemia, history of AVNRT status post ablation in 2010 and peripheral artery disease presented from cardiology office with acute CHF exacerbation. Patient reported increased swelling of his legs associated with shortness of breath and weakness.symptoms have been ongoing for 6-8 weeks and worsening for the past 1 week.reports 2 pillow orthopnea but no PND. Denied any chest pain. Has chronic constipation. Patient was seen in cardiology office for cardiac clearance for upcoming back surgery on 10/19.     Subjective:   Diuresing well on Lasix drip but still with significant leg edema.   Assessment  & Plan :    Principal Problem: Acute on chronic combined systolic and diastolic CHF. Noticed to be over by almost 30 pounds and being diuresed aggressively. (Having difficulty weighing him as patient sits on the recliner and nursing staff having difficulty getting him onto the bed or stand him up to weight him)  Was receiving IV Lasix 80 mg twice a day, switched to Lasix drip given persistent leg swelling(since 10/15) . Monitor strict I/O and daily weight. Monitor daily potassium and renal function. Beta blocker dose reduced due to bradycardia. Lisinopril discontinued due to  acute kidney injury. 2-D echo with EF of 50-55%, no wall motion abnormality and unable to detect diastolic dysfunction.  Cardiology consult following.   Active Problems: Atrial fibrillation/flutter Status post ablation for AVNRT. CHADS2vasc of 3. continue Eliquis and lower dose BB.   Bradycardia Heart rate dropped to 20s on 10/12 while asleep. Metoprolol dose reduced to 12.5 mg twice a day. Heart rate improved, occasionally >110. Added nighttime CPAP for possible sleep apnea. He will  need outpatient sleep study done. Heart rate currently stable.   Hypokalemia Replenished. magnesium normal.  Acute kidney injury Secondary to dehydration +/- cardiorenal syndrome. Holding lisinopril. Improving on aggressive diuresis.   COPD Stable. Continue home inhalers.  morbid obesity / ?OSA Needs counseling on diet and exercise.nighttime CPAP while in the hospital.  Tobacco abuse Smokes one pack per day.Counseled on cessation. Nicotine patch.     Code Status : full code  Family Communication  : none at bedside  Disposition Plan  : home pending clinical improvement . May need several days until diuresed aggressively.   Barriers For Discharge : active symptoms  Consults  :  cardiology  Procedures   2-D echo  DVT Prophylaxis  : eliquis  Lab Results  Component Value Date   PLT 210 09/13/2017    Antibiotics  :    Anti-infectives    None        Objective:   Vitals:   09/12/17 2158 09/13/17 0508 09/13/17 0802 09/13/17 1029  BP: 122/67 (!) 136/58    Pulse: (!) 49 (!) 42  (!) 134  Resp: 20 18    Temp: 98.4 F (36.9 C) 98.5 F (36.9 C)    TempSrc: Oral Oral    SpO2: 94% 95% 91%   Weight:      Height:        Wt Readings from Last 3 Encounters:  09/11/17 (!) 137.6 kg (303 lb 4.8 oz)  09/07/17 (!) 144.7 kg (319 lb)  01/15/10 (!) 138.3 kg (305 lb)     Intake/Output Summary (Last 24 hours) at 09/13/17 1130 Last data filed at 09/13/17 1020  Gross per 24 hour    Intake           319.54 ml  Output             3200 ml  Net         -2880.46 ml     Physical Exam Gen.: Elderly male not in distress  HEENT: Moist because her, supple neck Chest: Decreased breath sounds overt lung bases CVS: S1 and S2 irregular, no murmurs rubs or gallop GI: Soft, nondistended, nontender musculoskeletal: 3+ tense pitting edema bilaterally        Data Review:    CBC  Recent Labs Lab 09/07/17 1527 09/08/17 0108 09/09/17 0534 09/11/17 0553 09/13/17 0542  WBC 7.9 7.7 8.3 8.9 9.6  HGB 13.7 12.6* 12.7* 13.4 13.6  HCT 42.4 39.5 40.7 42.5 41.9  PLT 181 177 178 184 210  MCV 98.4 98.3 100.0 99.5 98.1  MCH 31.8 31.3 31.2 31.4 31.9  MCHC 32.3 31.9 31.2 31.5 32.5  RDW 16.0* 16.0* 16.4* 16.0* 15.9*  LYMPHSABS  --  2.4  --   --   --   MONOABS  --  1.2*  --   --   --   EOSABS  --  0.2  --   --   --   BASOSABS  --  0.1  --   --   --     Chemistries   Recent Labs Lab 09/07/17 1919  09/09/17 0534 09/10/17 0733 09/11/17 0553 09/12/17 0650 09/13/17 0542  NA  --   < > 138 136 136 134* 134*  K  --   < > 3.8 3.4* 4.4 4.6 4.9  CL  --   < > 96* 91* 92* 91* 92*  CO2  --   < > 32 34* 34* 33* 31  GLUCOSE  --   < >  90 94 94 88 97  BUN  --   < > 19 19 27* 41* 45*  CREATININE  --   < > 1.17 1.09 1.35* 1.98* 1.74*  CALCIUM  --   < > 9.0 9.4 9.4 9.1 9.5  MG 2.0  --   --   --  2.3  --   --   < > = values in this interval not displayed. ------------------------------------------------------------------------------------------------------------------ No results for input(s): CHOL, HDL, LDLCALC, TRIG, CHOLHDL, LDLDIRECT in the last 72 hours.  No results found for: HGBA1C ------------------------------------------------------------------------------------------------------------------ No results for input(s): TSH, T4TOTAL, T3FREE, THYROIDAB in the last 72 hours.  Invalid input(s):  FREET3 ------------------------------------------------------------------------------------------------------------------ No results for input(s): VITAMINB12, FOLATE, FERRITIN, TIBC, IRON, RETICCTPCT in the last 72 hours.  Coagulation profile  Recent Labs Lab 09/07/17 1919  INR 1.11    No results for input(s): DDIMER in the last 72 hours.  Cardiac Enzymes  Recent Labs Lab 09/07/17 1919 09/08/17 0108 09/08/17 0706  TROPONINI 0.09* 0.15* 0.12*   ------------------------------------------------------------------------------------------------------------------    Component Value Date/Time   BNP 747.0 (H) 09/07/2017 1527    Inpatient Medications  Scheduled Meds: . apixaban  5 mg Oral BID  . citalopram  20 mg Oral q morning - 10a  . famotidine  40 mg Oral QHS  . gabapentin  300 mg Oral TID  . LORazepam  1 mg Oral Q8H  . metoprolol tartrate  12.5 mg Oral BID  . nicotine  14 mg Transdermal Daily  . potassium chloride  40 mEq Oral BID  . sodium chloride flush  3 mL Intravenous Q12H  . umeclidinium bromide  1 puff Inhalation Daily   Continuous Infusions: . sodium chloride    . furosemide (LASIX) infusion 4 mg/hr (09/12/17 1044)   PRN Meds:.sodium chloride, acetaminophen, albuterol, ondansetron (ZOFRAN) IV, sodium chloride flush  Micro Results No results found for this or any previous visit (from the past 240 hour(s)).  Radiology Reports Dg Chest Portable 1 View  Result Date: 09/07/2017 CLINICAL DATA:  Fluid retention, shortness of breath, and irregular heartbeat. Current smoker. History of COPD, cardiac ablation, morbid obesity. EXAM: PORTABLE CHEST 1 VIEW COMPARISON:  Chest x-ray of May 23, 2017 FINDINGS: The cardiopericardial silhouette remains enlarged. The pulmonary vascularity is engorged and there is mild cephalization. The interstitial markings are both lungs are mildly increased. There is no pleural effusion. There is calcification in the wall of the aortic  arch. The observed bony thorax is unremarkable. IMPRESSION: CHF with mild interstitial edema.  No alveolar pneumonia. Thoracic aortic atherosclerosis. Electronically Signed   By: David  [place] M.D.   On: 09/07/2017 15:53    Time Spent in minutes 35   Eddie North M.D on 09/13/2017 at 11:30 AM  Between 7am to 7pm - Pager - [phone]  After 7pm go to [email or web address] - password [facility]  [facility] -  Office  [phone]

## 2017-09-14 DIAGNOSIS — I5043 Acute on chronic combined systolic (congestive) and diastolic (congestive) heart failure: Secondary | ICD-10-CM

## 2017-09-14 LAB — BASIC METABOLIC PANEL
Anion gap: 10 (ref 5–15)
BUN: 39 mg/dL — ABNORMAL HIGH (ref 6–20)
CALCIUM: 9.5 mg/dL (ref 8.9–10.3)
CO2: 31 mmol/L (ref 22–32)
CREATININE: 1.5 mg/dL — AB (ref 0.61–1.24)
Chloride: 94 mmol/L — ABNORMAL LOW (ref 101–111)
GFR, EST AFRICAN AMERICAN: 54 mL/min — AB (ref >60)
GFR, EST NON AFRICAN AMERICAN: 46 mL/min — AB (ref >60)
GLUCOSE: 92 mg/dL (ref 65–99)
Potassium: 4.7 mmol/L (ref 3.5–5.1)
Sodium: 135 mmol/L (ref 135–145)

## 2017-09-14 NOTE — Progress Notes (Signed)
Physical Therapy Treatment Patient Details Name: INOCENTE KRACH MRN: [id number] DOB: [birth year] Today's Date: 09/14/2017    History of Present Illness KAILAND SEDA is a 66 y.o. male with medical history significant of PAD; anxiety/depression/panic; OSA not on CPAP; HLD; COPD; and h/o AVNRT s/p ablation in 2010 presenting from Dr. Junius Argyle office with apparent CHF exacerbation.  He complains of swelling, fluid, numbness, weakness.  Can't breathe, SOB.  Constipated.  Symptoms have been going on for 6-8 weeks.  Symptoms started right after his last PCP visit.  Today, he went to the cardiologist and he sent him to [facility] for admission.  SOB for the last week all the time, previously it was intermittent but not necessarily exertional.  Unable to walk around due to back pain.  Previously slept on 2 pillows but now sleeping in a recliner.  No PND.  No chest pain but he has had jaw pain, comes and goes, maybe 6 total times but very transient; he associated it with his neck.  Constipation is chronic, he is taking pain medication.  Linzess makes him have fecal urgency and he can't get to the bathroom soon enough so he doesn't take it much.  He was supposed to have back surgery on Oct 19 and he was sent to cardiology for cardiac clearance.    PT Comments    Patient tolerate standing and taking a few shuffling side steps, unable to transfer to the chair due to knees buckling and had to sit to avoid falling.  Patient able sit up at bedside to eat lunch after therapy - nursing staff notified.  Patient will benefit from continued physical therapy in hospital and recommended venue below to increase strength, balance, endurance for safe ADLs and gait.   Follow Up Recommendations  SNF     Equipment Recommendations  None recommended by PT    Recommendations for Other Services       Precautions / Restrictions Precautions Precautions: Fall Precaution Comments: right toe drag due to limited ankle  dorsiflexion Restrictions Weight Bearing Restrictions: No    Mobility  Bed Mobility Overal bed mobility: Needs Assistance Bed Mobility: Supine to Sit;Sit to Supine     Supine to sit: Min assist Sit to supine: Min assist      Transfers Overall transfer level: Needs assistance Equipment used: Rolling walker (2 wheeled) Transfers: Sit to/from Stand Sit to Stand: Max assist;Mod assist            Ambulation/Gait Ambulation/Gait assistance: Max Environmental consultant (Feet): 2 Feet Assistive device: Rolling walker (2 wheeled) Gait Pattern/deviations: Decreased stance time - right;Decreased stance time - left;Decreased step length - right;Decreased step length - left Gait velocity: slow Gait velocity interpretation: Below normal speed for age/gender General Gait Details: Patient limited to a few shuffling side steps at bedside due to knees buckling, poor balance, and BLE weakness, right weaker than left   Stairs            Wheelchair Mobility    Modified Rankin (Stroke Patients Only)       Balance Overall balance assessment: Needs assistance Sitting-balance support: Feet supported;No upper extremity supported Sitting balance-Leahy Scale: Good     Standing balance support: Bilateral upper extremity supported;During functional activity Standing balance-Leahy Scale: Poor                              Cognition Arousal/Alertness: Awake/alert Behavior During Therapy: WFL for tasks assessed/performed Overall  Cognitive Status: Within Functional Limits for tasks assessed                                        Exercises General Exercises - Lower Extremity Ankle Circles/Pumps: AROM;Strengthening;Both;Supine (patient received manual strethcing to right heelcords x 3 minute holds x 2) Long Arc Quad: Seated;AROM;Strengthening;Both;10 reps Hip Flexion/Marching: Seated;AROM;Strengthening;Both;10 reps    General Comments         Pertinent Vitals/Pain Pain Assessment: No/denies pain    Home Living                      Prior Function            PT Goals (current goals can now be found in the care plan section) Acute Rehab PT Goals Patient Stated Goal: return home PT Goal Formulation: With patient Time For Goal Achievement: 09/20/17 Potential to Achieve Goals: Good Progress towards PT goals: Progressing toward goals    Frequency    Min 3X/week      PT Plan Current plan remains appropriate    Co-evaluation              AM-PAC PT "6 Clicks" Daily Activity  Outcome Measure  Difficulty turning over in bed (including adjusting bedclothes, sheets and blankets)?: A Little Difficulty moving from lying on back to sitting on the side of the bed? : A Little   Help needed moving to and from a bed to chair (including a wheelchair)?: A Lot Help needed walking in hospital room?: Total Help needed climbing 3-5 steps with a railing? : Total 6 Click Score: 10    End of Session Equipment Utilized During Treatment: Gait belt Activity Tolerance: Patient limited by fatigue Patient left: in bed;with call bell/phone within reach (seated at bedside to eat lunch) Nurse Communication: Mobility status PT Visit Diagnosis: Unsteadiness on feet (R26.81);Other abnormalities of gait and mobility (R26.89);Muscle weakness (generalized) (M62.81)     Time: 1106-1140 PT Time Calculation (min) (ACUTE ONLY): 34 min  Charges:  $Therapeutic Activity: 23-37 mins                    G Codes:       2:04 PM, 09/14/17 Ocie BobJames Heaven [place]andell, MPT Physical Therapist [facility] [phone] office [phone] mobile phone

## 2017-09-14 NOTE — Progress Notes (Signed)
PROGRESS NOTE                                                                                                                                                                                                             Patient Demographics:    Edward Andrews, is a 66 y.o. male, DOB - 1951/06/06, ZOX:[id number]  Admit date - 09/07/2017   Admitting Physician Jonah Blue, MD  Outpatient Primary MD for the patient is Toma Deiters, MD  LOS - 7  Outpatient Specialists: cardiology  Chief Complaint  Patient presents with  . Groin Swelling  . Leg Swelling       Brief Narrative   66 year old male with history of anxiety, depression, OSA not on CPAP, COPD, hyperlipidemia, history of AVNRT status post ablation in 2010 and peripheral artery disease presented from cardiology office with acute CHF exacerbation. Patient reported increased swelling of his legs associated with shortness of breath and weakness.symptoms have been ongoing for 6-8 weeks and worsening for the past 1 week.reports 2 pillow orthopnea but no PND. Denied any chest pain. Has chronic constipation. Patient was seen in cardiology office for cardiac clearance for upcoming back surgery on 10/19.     Subjective:   Shortness of breath improving. Feels that leg edema is improving.   Assessment  & Plan :    Principal Problem: Acute on chronic combined systolic and diastolic CHF. Noticed to be over by almost 30 pounds on admissionand being diuresed aggressively. (Having difficulty weighing him as patient sits on the recliner and nursing staff having difficulty getting him onto the bed or stand him up to weight him)  Was receiving IV Lasix 80 mg twice a day, switched to Lasix drip given persistent leg swelling(since 10/15) . Monitor strict I/O and daily weight. Monitor daily potassium and renal function. Net volume statussince admission is -18.8 L. He is still volume  overloaded and will need continued diuresis. Beta blocker dose reduced due to bradycardia. Lisinopril discontinued due to acute kidney injury. 2-D echo with EF of 50-55%, no wall motion abnormality and unable to detect diastolic dysfunction.  Cardiology consult following.   Active Problems: Atrial fibrillation/flutter Status post ablation for AVNRT. CHADS2vasc of 3. continue Eliquis and lower dose BB.   Bradycardia Heart rate dropped to 20s on 10/12 while asleep. Metoprolol dose reduced to 12.5 mg  twice a day. Heart rate improved, occasionally >110. Added nighttime CPAP for possible sleep apnea. He will need outpatient sleep study done. Heart rate currently stable.   Hypokalemia Replenished. magnesium normal.  Acute kidney injury Secondary to dehydration +/- cardiorenal syndrome. Holding lisinopril. Improving on aggressive diuresis.  COPD Stable. Continue home inhalers.  morbid obesity / ?OSA Needs counseling on diet and exercise.nighttime CPAP while in the hospital.  Tobacco abuse Smokes one pack per day.Counseled on cessation. Nicotine patch.     Code Status : full code  Family Communication  : none at bedside  Disposition Plan  : physical therapy has recommended skilled nursing facility placement..   Barriers For Discharge : active symptoms  Consults  :  cardiology  Procedures   2-D echo: Left ventricle: The cavity size was normal. Systolic function was   normal. The estimated ejection fraction was in the range of 50%   to 55%. Wall motion was normal; there were no regional wall   motion abnormalities. The study is not technically sufficient to   allow evaluation of LV diastolic function. - Mitral valve: Calcified annulus. Mildly thickened leaflets .   There was mild regurgitation. - Atrial septum: No defect or patent foramen ovale was identified. - Pulmonary arteries: PA peak pressure: 41 mm Hg (S).  DVT Prophylaxis  : eliquis  Lab Results  Component  Value Date   PLT 210 09/13/2017    Antibiotics  :    Anti-infectives    None        Objective:   Vitals:   09/13/17 2232 09/14/17 0519 09/14/17 0818 09/14/17 1300  BP: (!) 92/52 123/82  118/69  Pulse: (!) 54 (!) 125  78  Resp: 20 20  20   Temp: 99.1 F (37.3 C) 98 F (36.7 C)  98.1 F (36.7 C)  TempSrc: Oral Oral  Oral  SpO2: 94% 94% 93% 94%  Weight:  134.2 kg (295 lb 14.4 oz)    Height:        Wt Readings from Last 3 Encounters:  09/14/17 134.2 kg (295 lb 14.4 oz)  09/07/17 (!) 144.7 kg (319 lb)  01/15/10 (!) 138.3 kg (305 lb)     Intake/Output Summary (Last 24 hours) at 09/14/17 1742 Last data filed at 09/14/17 1717  Gross per 24 hour  Intake          1017.53 ml  Output             4100 ml  Net         -3082.47 ml     Physical Exam Gen.: Elderly male not in distress  HEENT: Moist because her, supple neck Chest: Decreased breath sounds over lung bases CVS: S1 and S2 irregular, no murmurs rubs or gallop GI: Soft, nondistended, nontender musculoskeletal: 2+ tense pitting edema bilaterally        Data Review:    CBC  Recent Labs Lab 09/08/17 0108 09/09/17 0534 09/11/17 0553 09/13/17 0542  WBC 7.7 8.3 8.9 9.6  HGB 12.6* 12.7* 13.4 13.6  HCT 39.5 40.7 42.5 41.9  PLT 177 178 184 210  MCV 98.3 100.0 99.5 98.1  MCH 31.3 31.2 31.4 31.9  MCHC 31.9 31.2 31.5 32.5  RDW 16.0* 16.4* 16.0* 15.9*  LYMPHSABS 2.4  --   --   --   MONOABS 1.2*  --   --   --   EOSABS 0.2  --   --   --   BASOSABS 0.1  --   --   --  Chemistries   Recent Labs Lab 09/07/17 1919  09/10/17 0733 09/11/17 0553 09/12/17 0650 09/13/17 0542 09/14/17 0422  NA  --   < > 136 136 134* 134* 135  K  --   < > 3.4* 4.4 4.6 4.9 4.7  CL  --   < > 91* 92* 91* 92* 94*  CO2  --   < > 34* 34* 33* 31 31  GLUCOSE  --   < > 94 94 88 97 92  BUN  --   < > 19 27* 41* 45* 39*  CREATININE  --   < > 1.09 1.35* 1.98* 1.74* 1.50*  CALCIUM  --   < > 9.4 9.4 9.1 9.5 9.5  MG 2.0  --   --   2.3  --   --   --   < > = values in this interval not displayed. ------------------------------------------------------------------------------------------------------------------ No results for input(s): CHOL, HDL, LDLCALC, TRIG, CHOLHDL, LDLDIRECT in the last 72 hours.  No results found for: HGBA1C ------------------------------------------------------------------------------------------------------------------ No results for input(s): TSH, T4TOTAL, T3FREE, THYROIDAB in the last 72 hours.  Invalid input(s): FREET3 ------------------------------------------------------------------------------------------------------------------ No results for input(s): VITAMINB12, FOLATE, FERRITIN, TIBC, IRON, RETICCTPCT in the last 72 hours.  Coagulation profile  Recent Labs Lab 09/07/17 1919  INR 1.11    No results for input(s): DDIMER in the last 72 hours.  Cardiac Enzymes  Recent Labs Lab 09/07/17 1919 09/08/17 0108 09/08/17 0706  TROPONINI 0.09* 0.15* 0.12*   ------------------------------------------------------------------------------------------------------------------    Component Value Date/Time   BNP 747.0 (H) 09/07/2017 1527    Inpatient Medications  Scheduled Meds: . apixaban  5 mg Oral BID  . citalopram  20 mg Oral q morning - 10a  . famotidine  40 mg Oral QHS  . gabapentin  300 mg Oral TID  . LORazepam  1 mg Oral Q8H  . metoprolol tartrate  12.5 mg Oral BID  . nicotine  14 mg Transdermal Daily  . potassium chloride  40 mEq Oral BID  . sodium chloride flush  3 mL Intravenous Q12H  . umeclidinium bromide  1 puff Inhalation Daily   Continuous Infusions: . sodium chloride    . furosemide (LASIX) infusion 4 mg/hr (09/12/17 1044)   PRN Meds:.sodium chloride, acetaminophen, albuterol, ondansetron (ZOFRAN) IV, sodium chloride flush  Micro Results No results found for this or any previous visit (from the past 240 hour(s)).  Radiology Reports Dg Chest Portable 1  View  Result Date: 09/07/2017 CLINICAL DATA:  Fluid retention, shortness of breath, and irregular heartbeat. Current smoker. History of COPD, cardiac ablation, morbid obesity. EXAM: PORTABLE CHEST 1 VIEW COMPARISON:  Chest x-ray of May 23, 2017 FINDINGS: The cardiopericardial silhouette remains enlarged. The pulmonary vascularity is engorged and there is mild cephalization. The interstitial markings are both lungs are mildly increased. There is no pleural effusion. There is calcification in the wall of the aortic arch. The observed bony thorax is unremarkable. IMPRESSION: CHF with mild interstitial edema.  No alveolar pneumonia. Thoracic aortic atherosclerosis. Electronically Signed   By: David  [place] M.D.   On: 09/07/2017 15:53    Time Spent in minutes 35   MEMON,JEHANZEB M.D on 09/14/2017 at 5:42 PM  Between 7am to 7pm - Pager - [phone]  After 7pm go to [email or web address] - password [facility]  [facility] -  Office  [phone]

## 2017-09-14 NOTE — Progress Notes (Signed)
Progress Note  Patient Name: Edward Andrews Date of Encounter: 09/14/2017  Primary Cardiologist: Dr. Ermalene SearingSuresh Konewaran  Subjective   States that his legs are feeling better. No chest pain or shortness of breath at rest but still has dyspnea on exertion. Heart rate goes up when he moves around in his room.  Inpatient Medications    Scheduled Meds: . apixaban  5 mg Oral BID  . citalopram  20 mg Oral q morning - 10a  . famotidine  40 mg Oral QHS  . gabapentin  300 mg Oral TID  . LORazepam  1 mg Oral Q8H  . metoprolol tartrate  12.5 mg Oral BID  . nicotine  14 mg Transdermal Daily  . potassium chloride  40 mEq Oral BID  . sodium chloride flush  3 mL Intravenous Q12H  . umeclidinium bromide  1 puff Inhalation Daily   Continuous Infusions: . sodium chloride    . furosemide (LASIX) infusion 4 mg/hr (09/12/17 1044)   PRN Meds: sodium chloride, acetaminophen, albuterol, ondansetron (ZOFRAN) IV, sodium chloride flush   Vital Signs    Vitals:   09/13/17 1500 09/13/17 2232 09/14/17 0519 09/14/17 0818  BP: (!) 140/59 (!) 92/52 123/82   Pulse: 81 (!) 54 (!) 125   Resp: 18 20 20    Temp: 98.8 F (37.1 C) 99.1 F (37.3 C) 98 F (36.7 C)   TempSrc: Oral Oral Oral   SpO2: 97% 94% 94% 93%  Weight:   295 lb 14.4 oz (134.2 kg)   Height:        Intake/Output Summary (Last 24 hours) at 09/14/17 1100 Last data filed at 09/14/17 0701  Gross per 24 hour  Intake           571.06 ml  Output             4450 ml  Net         -3878.94 ml   Filed Weights   09/11/17 0935 09/13/17 1424 09/14/17 0519  Weight: (!) 303 lb 4.8 oz (137.6 kg) (!) 303 lb 6.4 oz (137.6 kg) 295 lb 14.4 oz (134.2 kg)    Telemetry    Typical atrial flutter with variable conduction. Personally reviewed.  Physical Exam   GEN: Morbidly obese male. No acute distress.   Neck: No JVD. Cardiac:  Irregularly irregular, soft systolic murmur, no gallop.  Respiratory:  diminished breath sounds without wheezing. GI:   Obese, nontender, bowel sounds present. MS: 2-3+ leg edema; No deformity. Neuro:  Nonfocal. Psych: Alert and oriented x 3. Normal affect.  Labs    Chemistry Recent Labs Lab 09/12/17 0650 09/13/17 0542 09/14/17 0422  NA 134* 134* 135  K 4.6 4.9 4.7  CL 91* 92* 94*  CO2 33* 31 31  GLUCOSE 88 97 92  BUN 41* 45* 39*  CREATININE 1.98* 1.74* 1.50*  CALCIUM 9.1 9.5 9.5  GFRNONAA 33* 39* 46*  GFRAA 38* 45* 54*  ANIONGAP 10 11 10      Hematology Recent Labs Lab 09/09/17 0534 09/11/17 0553 09/13/17 0542  WBC 8.3 8.9 9.6  RBC 4.07* 4.27 4.27  HGB 12.7* 13.4 13.6  HCT 40.7 42.5 41.9  MCV 100.0 99.5 98.1  MCH 31.2 31.4 31.9  MCHC 31.2 31.5 32.5  RDW 16.4* 16.0* 15.9*  PLT 178 184 210    Cardiac Enzymes Recent Labs Lab 09/07/17 1919 09/08/17 0108 09/08/17 0706  TROPONINI 0.09* 0.15* 0.12*    Recent Labs Lab 09/07/17 1534  TROPIPOC 0.09*  BNP Recent Labs Lab 09/07/17 1527  BNP 747.0*     Radiology    No results found.  Cardiac Studies   Echocardiogram 09/08/2017: Study Conclusions  - Left ventricle: The cavity size was normal. Systolic function was   normal. The estimated ejection fraction was in the range of 50%   to 55%. Wall motion was normal; there were no regional wall   motion abnormalities. The study is not technically sufficient to   allow evaluation of LV diastolic function. - Mitral valve: Calcified annulus. Mildly thickened leaflets .   There was mild regurgitation. - Atrial septum: No defect or patent foramen ovale was identified. - Pulmonary arteries: PA peak pressure: 41 mm Hg (S).  Patient Profile     66 y.o. male with a history of nonobstructive CAD, PSVT status post ablation, hyperlipidemia, COPD, OSA, PAD with previous right common iliac stenting, now admitted to the hospital with acute on chronic diastolic heart failure as well as typical atrial flutter.  Assessment & Plan    1. Acute on chronic diastolic heart failure.  Continues to diurese very well on Lasix infusion. 5000 cc out more than in last 24 hours. Weight now down under 300 pounds. Still does not look to be at baseline.  2. Typical atrial flutter with variable conduction and intermittent bradycardia. CHADSVASC score is 2-3. He continue his on Eliquis and low-dose Lopressor.  3. Acute renal insufficiency, creatinine continues to improve, now down to 1.5.  4. OSA.  Continue Lasix infusion. He is diuresing vigorously and renal function continues to improve. Still not at baseline in terms of fluid status.  Signed, Nona Dell, MD  09/14/2017, 11:00 AM

## 2017-09-15 DIAGNOSIS — E876 Hypokalemia: Secondary | ICD-10-CM

## 2017-09-15 DIAGNOSIS — N179 Acute kidney failure, unspecified: Secondary | ICD-10-CM

## 2017-09-15 LAB — BASIC METABOLIC PANEL
ANION GAP: 11 (ref 5–15)
BUN: 37 mg/dL — ABNORMAL HIGH (ref 6–20)
CO2: 29 mmol/L (ref 22–32)
Calcium: 9.7 mg/dL (ref 8.9–10.3)
Chloride: 93 mmol/L — ABNORMAL LOW (ref 101–111)
Creatinine, Ser: 1.36 mg/dL — ABNORMAL HIGH (ref 0.61–1.24)
GFR, EST NON AFRICAN AMERICAN: 52 mL/min — AB (ref >60)
GLUCOSE: 97 mg/dL (ref 65–99)
POTASSIUM: 4.6 mmol/L (ref 3.5–5.1)
Sodium: 133 mmol/L — ABNORMAL LOW (ref 135–145)

## 2017-09-15 LAB — CBC
HEMATOCRIT: 42.9 % (ref 39.0–52.0)
Hemoglobin: 13.9 g/dL (ref 13.0–17.0)
MCH: 31.9 pg (ref 26.0–34.0)
MCHC: 32.4 g/dL (ref 30.0–36.0)
MCV: 98.4 fL (ref 78.0–100.0)
Platelets: 228 10*3/uL (ref 150–400)
RBC: 4.36 MIL/uL (ref 4.22–5.81)
RDW: 15.1 % (ref 11.5–15.5)
WBC: 9.4 10*3/uL (ref 4.0–10.5)

## 2017-09-15 MED ORDER — METOPROLOL TARTRATE 25 MG PO TABS
25.0000 mg | ORAL_TABLET | Freq: Two times a day (BID) | ORAL | Status: DC
  Administered 2017-09-15 – 2017-09-17 (×5): 25 mg via ORAL
  Filled 2017-09-15 (×5): qty 1

## 2017-09-15 MED ORDER — FUROSEMIDE 10 MG/ML IJ SOLN
INTRAMUSCULAR | Status: AC
  Filled 2017-09-15: qty 30

## 2017-09-15 NOTE — Progress Notes (Signed)
Progress Note  Patient Name: Edward Andrews Date of Encounter: 09/15/2017  Primary Cardiologist: Dr. Prentice DockerSuresh Koneswaran  Subjective   Continues to feel better, less short of breath and improving leg edema by the day. Still not during mobile, working with PT. Heart rate elevated activity.  Inpatient Medications    Scheduled Meds: . apixaban  5 mg Oral BID  . citalopram  20 mg Oral q morning - 10a  . famotidine  40 mg Oral QHS  . gabapentin  300 mg Oral TID  . LORazepam  1 mg Oral Q8H  . metoprolol tartrate  12.5 mg Oral BID  . nicotine  14 mg Transdermal Daily  . potassium chloride  40 mEq Oral BID  . sodium chloride flush  3 mL Intravenous Q12H  . umeclidinium bromide  1 puff Inhalation Daily   Continuous Infusions: . sodium chloride    . furosemide (LASIX) infusion 4 mg/hr (09/15/17 0011)   PRN Meds: sodium chloride, acetaminophen, albuterol, ondansetron (ZOFRAN) IV, sodium chloride flush   Vital Signs    Vitals:   09/14/17 2027 09/14/17 2250 09/15/17 0605 09/15/17 0745  BP:  93/73 (!) 103/52   Pulse:  (!) 127 64   Resp:  17 18   Temp:  99.1 F (37.3 C) (!) 97.3 F (36.3 C)   TempSrc:  Oral Oral   SpO2: 94% 93% 96% 95%  Weight:   286 lb 6.4 oz (129.9 kg)   Height:        Intake/Output Summary (Last 24 hours) at 09/15/17 0841 Last data filed at 09/15/17 0605  Gross per 24 hour  Intake            578.8 ml  Output             4150 ml  Net          -3571.2 ml   Filed Weights   09/13/17 1424 09/14/17 0519 09/15/17 0605  Weight: (!) 303 lb 6.4 oz (137.6 kg) 295 lb 14.4 oz (134.2 kg) 286 lb 6.4 oz (129.9 kg)    Telemetry    Typical atrial flutter with variable conduction. Personally reviewed.  Physical Exam   GEN: Morbidly obese male. No acute distress.   Neck: No JVD. Cardiac:  Irregularly irregular, soft systolic murmur, rub, or gallop.  Respiratory: Nonlabored. No wheezing. GI:  Obese, nontender, bowel sounds present. MS: 2+ leg edema; No  deformity. Neuro:  Nonfocal. Psych: Alert and oriented x 3. Normal affect.  Labs    Chemistry Recent Labs Lab 09/13/17 0542 09/14/17 0422 09/15/17 0455  NA 134* 135 133*  K 4.9 4.7 4.6  CL 92* 94* 93*  CO2 31 31 29   GLUCOSE 97 92 97  BUN 45* 39* 37*  CREATININE 1.74* 1.50* 1.36*  CALCIUM 9.5 9.5 9.7  GFRNONAA 39* 46* 52*  GFRAA 45* 54* >60  ANIONGAP 11 10 11      Hematology Recent Labs Lab 09/11/17 0553 09/13/17 0542 09/15/17 0455  WBC 8.9 9.6 9.4  RBC 4.27 4.27 4.36  HGB 13.4 13.6 13.9  HCT 42.5 41.9 42.9  MCV 99.5 98.1 98.4  MCH 31.4 31.9 31.9  MCHC 31.5 32.5 32.4  RDW 16.0* 15.9* 15.1  PLT 184 210 228    Radiology    No results found.  Cardiac Studies   Echocardiogram 09/08/2017: Study Conclusions  - Left ventricle: The cavity size was normal. Systolic function was normal. The estimated ejection fraction was in the range of 50% to 55%.  Wall motion was normal; there were no regional wall motion abnormalities. The study is not technically sufficient to allow evaluation of LV diastolic function. - Mitral valve: Calcified annulus. Mildly thickened leaflets . There was mild regurgitation. - Atrial septum: No defect or patent foramen ovale was identified. - Pulmonary arteries: PA peak pressure: 41 mm Hg (S).  Patient Profile     66 y.o. male with a history of nonobstructive CAD, PSVT status post ablation, hyperlipidemia, COPD, OSA, PAD with previous right common iliac stenting, now admitted to the hospital with acute on chronic diastolic heart failure as well as typical atrial flutter.  Assessment & Plan    1. Acute on chronic diastolic heart failure, continues to improve clinically with excellent diuresis on Lasix infusion. Not entirely certain about baseline weight, he is down in the 280s at this point. Still has evidence of leg edema and creatinine is improving in the face of continued diuresis.  2. Typical atrial flutter with variable  conduction. Fewer episodes of significant bradycardia. CHADSVASC score is 2-3 and he remains on Eliquis.  3. Acute renal insufficiency, creatinine continues to improve, down to 1.3.  4. Obesity with OSA.  No change in Lasix infusion. Follow-up BMET in a.m. Gradually increase Lopressor to 25 mg twice daily. Needs to work with PT.  Signed, Nona Dell, MD  09/15/2017, 8:41 AM

## 2017-09-15 NOTE — Progress Notes (Signed)
PROGRESS NOTE                                                                                                                                                                                                             Patient Demographics:    Edward Andrews, is a 66 y.o. male, DOB - [birth year], WUJ:[id number]  Admit date - 09/07/2017   Admitting Physician Jonah Blue, MD  Outpatient Primary MD for the patient is Toma Deiters, MD  LOS - 8  Outpatient Specialists: cardiology  Chief Complaint  Patient presents with  . Groin Swelling  . Leg Swelling       Brief Narrative   66 year old male with history of anxiety, depression, OSA not on CPAP, COPD, hyperlipidemia, history of AVNRT status post ablation in 2010 and peripheral artery disease presented from cardiology office with acute CHF exacerbation. Patient reported increased swelling of his legs associated with shortness of breath and weakness.symptoms have been ongoing for 6-8 weeks and worsening for the past 1 week.reports 2 pillow orthopnea but no PND. Denied any chest pain. Has chronic constipation. Patient was seen in cardiology office for cardiac clearance for upcoming back surgery on 10/19.     Subjective:   No chest pain. Feels lower extremity edema is improving.    Assessment  & Plan :    Principal Problem: Acute on chronic combined systolic and diastolic CHF. Noticed to be over by almost 30 pounds on admissionand being diuresed aggressively. (Having difficulty weighing him as patient sits on the recliner and nursing staff having difficulty getting him onto the bed or stand him up to weight him)  Was receiving IV Lasix 80 mg twice a day, switched to Lasix drip given persistent leg swelling(since 10/15) . Monitor strict I/O and daily weight. Monitor daily potassium and renal function. Net volume status since admission is -22.5 L. He is still volume overloaded  and will need continued diuresis. Beta blocker dose reduced due to bradycardia. Lisinopril discontinued due to acute kidney injury. 2-D echo with EF of 50-55%, no wall motion abnormality and unable to detect diastolic dysfunction. Increasing metoprolol to 25mg  bid.  Cardiology consult following.   Active Problems: Atrial fibrillation/flutter Status post ablation for AVNRT. CHADS2vasc of 3. continue Eliquis and low dose BB.   Bradycardia Heart rate dropped to 20s on 10/12 while asleep.  Metoprolol dose reduced to 12.5 mg twice a day. Heart rate improved, occasionally >110. Added nighttime CPAP for possible sleep apnea. He will need outpatient sleep study done. Heart rate currently stable.   Hypokalemia Replenished. magnesium normal.  Acute kidney injury Secondary to dehydration +/- cardiorenal syndrome. Holding lisinopril. Improving on aggressive diuresis.  COPD Stable. Continue home inhalers.  morbid obesity / ?OSA Needs counseling on diet and exercise.nighttime CPAP while in the hospital.  Tobacco abuse Smokes one pack per day.Counseled on cessation. Nicotine patch.     Code Status : full code  Family Communication  : none at bedside  Disposition Plan  : physical therapy has recommended skilled nursing facility placement. Social work consulted.   Barriers For Discharge : active symptoms  Consults  :  cardiology  Procedures   2-D echo: Left ventricle: The cavity size was normal. Systolic function was   normal. The estimated ejection fraction was in the range of 50%   to 55%. Wall motion was normal; there were no regional wall   motion abnormalities. The study is not technically sufficient to   allow evaluation of LV diastolic function. - Mitral valve: Calcified annulus. Mildly thickened leaflets .   There was mild regurgitation. - Atrial septum: No defect or patent foramen ovale was identified. - Pulmonary arteries: PA peak pressure: 41 mm Hg (S).  DVT  Prophylaxis  : eliquis  Lab Results  Component Value Date   PLT 228 09/15/2017    Antibiotics  :    Anti-infectives    None        Objective:   Vitals:   09/14/17 2250 09/15/17 0605 09/15/17 0745 09/15/17 0914  BP: 93/73 (!) 103/52  119/78  Pulse: (!) 127 64  87  Resp: 17 18    Temp: 99.1 F (37.3 C) (!) 97.3 F (36.3 C)    TempSrc: Oral Oral    SpO2: 93% 96% 95%   Weight:  129.9 kg (286 lb 6.4 oz)    Height:        Wt Readings from Last 3 Encounters:  09/15/17 129.9 kg (286 lb 6.4 oz)  09/07/17 (!) 144.7 kg (319 lb)  01/15/10 (!) 138.3 kg (305 lb)     Intake/Output Summary (Last 24 hours) at 09/15/17 1411 Last data filed at 09/15/17 1000  Gross per 24 hour  Intake            438.8 ml  Output             5075 ml  Net          -4636.2 ml     Physical Exam Gen.: Elderly male not in distress  HEENT: Moist mucous membranes, supple neck Chest: Decreased breath sounds over lung bases CVS: S1 and S2 irregular, no murmurs rubs or gallop GI: Soft, nondistended, nontender musculoskeletal: 2+ tense pitting edema bilaterally        Data Review:    CBC  Recent Labs Lab 09/09/17 0534 09/11/17 0553 09/13/17 0542 09/15/17 0455  WBC 8.3 8.9 9.6 9.4  HGB 12.7* 13.4 13.6 13.9  HCT 40.7 42.5 41.9 42.9  PLT 178 184 210 228  MCV 100.0 99.5 98.1 98.4  MCH 31.2 31.4 31.9 31.9  MCHC 31.2 31.5 32.5 32.4  RDW 16.4* 16.0* 15.9* 15.1    Chemistries   Recent Labs Lab 09/11/17 0553 09/12/17 0650 09/13/17 0542 09/14/17 0422 09/15/17 0455  NA 136 134* 134* 135 133*  K 4.4 4.6 4.9 4.7 4.6  CL  92* 91* 92* 94* 93*  CO2 34* 33* 31 31 29   GLUCOSE 94 88 97 92 97  BUN 27* 41* 45* 39* 37*  CREATININE 1.35* 1.98* 1.74* 1.50* 1.36*  CALCIUM 9.4 9.1 9.5 9.5 9.7  MG 2.3  --   --   --   --    ------------------------------------------------------------------------------------------------------------------ No results for input(s): CHOL, HDL, LDLCALC, TRIG,  CHOLHDL, LDLDIRECT in the last 72 hours.  No results found for: HGBA1C ------------------------------------------------------------------------------------------------------------------ No results for input(s): TSH, T4TOTAL, T3FREE, THYROIDAB in the last 72 hours.  Invalid input(s): FREET3 ------------------------------------------------------------------------------------------------------------------ No results for input(s): VITAMINB12, FOLATE, FERRITIN, TIBC, IRON, RETICCTPCT in the last 72 hours.  Coagulation profile No results for input(s): INR, PROTIME in the last 168 hours.  No results for input(s): DDIMER in the last 72 hours.  Cardiac Enzymes No results for input(s): CKMB, TROPONINI, MYOGLOBIN in the last 168 hours.  Invalid input(s): CK ------------------------------------------------------------------------------------------------------------------    Component Value Date/Time   BNP 747.0 (H) 09/07/2017 1527    Inpatient Medications  Scheduled Meds: . apixaban  5 mg Oral BID  . citalopram  20 mg Oral q morning - 10a  . famotidine  40 mg Oral QHS  . gabapentin  300 mg Oral TID  . LORazepam  1 mg Oral Q8H  . metoprolol tartrate  25 mg Oral BID  . nicotine  14 mg Transdermal Daily  . potassium chloride  40 mEq Oral BID  . sodium chloride flush  3 mL Intravenous Q12H  . umeclidinium bromide  1 puff Inhalation Daily   Continuous Infusions: . sodium chloride    . furosemide (LASIX) infusion 4 mg/hr (09/15/17 0011)   PRN Meds:.sodium chloride, acetaminophen, albuterol, ondansetron (ZOFRAN) IV, sodium chloride flush  Micro Results No results found for this or any previous visit (from the past 240 hour(s)).  Radiology Reports Dg Chest Portable 1 View  Result Date: 09/07/2017 CLINICAL DATA:  Fluid retention, shortness of breath, and irregular heartbeat. Current smoker. History of COPD, cardiac ablation, morbid obesity. EXAM: PORTABLE CHEST 1 VIEW COMPARISON:   Chest x-ray of May 23, 2017 FINDINGS: The cardiopericardial silhouette remains enlarged. The pulmonary vascularity is engorged and there is mild cephalization. The interstitial markings are both lungs are mildly increased. There is no pleural effusion. There is calcification in the wall of the aortic arch. The observed bony thorax is unremarkable. IMPRESSION: CHF with mild interstitial edema.  No alveolar pneumonia. Thoracic aortic atherosclerosis. Electronically Signed   By: David  SwazilandJordan M.D.   On: 09/07/2017 15:53    Time Spent in minutes 35   Clay Solum M.D on 09/15/2017 at 2:11 PM  Between 7am to 7pm - Pager [phone]- [phone]  After 7pm go to [email or web address] - password [email or web address]  [facility] -  Office  [phone]

## 2017-09-16 LAB — BASIC METABOLIC PANEL
Anion gap: 11 (ref 5–15)
BUN: 48 mg/dL — ABNORMAL HIGH (ref 6–20)
CO2: 28 mmol/L (ref 22–32)
Calcium: 9.4 mg/dL (ref 8.9–10.3)
Chloride: 93 mmol/L — ABNORMAL LOW (ref 101–111)
Creatinine, Ser: 1.68 mg/dL — ABNORMAL HIGH (ref 0.61–1.24)
GFR calc non Af Amer: 40 mL/min — ABNORMAL LOW (ref >60)
GFR, EST AFRICAN AMERICAN: 47 mL/min — AB (ref >60)
Glucose, Bld: 105 mg/dL — ABNORMAL HIGH (ref 65–99)
Potassium: 4.5 mmol/L (ref 3.5–5.1)
Sodium: 132 mmol/L — ABNORMAL LOW (ref 135–145)

## 2017-09-16 MED ORDER — TORSEMIDE 20 MG PO TABS
60.0000 mg | ORAL_TABLET | Freq: Every day | ORAL | Status: DC
  Administered 2017-09-16 – 2017-09-17 (×2): 60 mg via ORAL
  Filled 2017-09-16 (×2): qty 3

## 2017-09-16 NOTE — Care Management Note (Signed)
Case Management Note  Patient Details  Name: Edward Andrews MRN: [id number] Date of Birth: 1950/12/04  Expected Discharge Date:       09/17/2017           Expected Discharge Plan:  Home w Home Health Services  In-House Referral:  NA  Discharge planning Services  CM Consult  Post Acute Care Choice:  Home Health Choice offered to:  Patient  [facility]  [facility], [facility]  [facility]  Status of Service:  Completed, signed off  Additional Comments: Pt has refused SNF. [facility] He has no preference of provider, [facility], aware HH has 48 hrs to make first visit. Bonita QuinLinda, [facility], aware of referral and will obtain pt info from chart.   Malcolm Metrohildress, Noelia Lenart Demske, RN 09/16/2017, 3:16 PM

## 2017-09-16 NOTE — Progress Notes (Signed)
PROGRESS NOTE                                                                                                                                                                                                             Patient Demographics:    Edward Andrews, is a 66 y.o. male, DOB - [birth year], [id number]  Admit date - 09/07/2017   Admitting Physician Jonah Blue, MD  Outpatient Primary MD for the patient is Toma Deiters, MD  LOS - 9  Outpatient Specialists: cardiology  Chief Complaint  Patient presents with  . Groin Swelling  . Leg Swelling       Brief Narrative   66 year old male with history of anxiety, depression, OSA not on CPAP, COPD, hyperlipidemia, history of AVNRT status post ablation in 2010 and peripheral artery disease presented from cardiology office with acute CHF exacerbation. Patient reported increased swelling of his legs associated with shortness of breath and weakness.symptoms have been ongoing for 6-8 weeks and worsening for the past 1 week.reports 2 pillow orthopnea but no PND. Denied any chest pain. Has chronic constipation. Patient was seen in cardiology office for cardiac clearance for upcoming back surgery on 10/19.     Subjective:   No shortness of breath or chest pain. Lower extremity edema continues to improve   Assessment  & Plan :    Principal Problem: Acute on chronic combined systolic and diastolic CHF. Noticed to be over by almost 30 pounds on admissionand being diuresed aggressively. (Having difficulty weighing him as patient sits on the recliner and nursing staff having difficulty getting him onto the bed or stand him up to weight him)  Was receiving IV Lasix 80 mg twice a day, switched to Lasix drip given persistent leg swelling(since 10/15) . Monitor strict I/O and daily weight. Monitor daily potassium and renal function. Net volume status since admission is -23.8 L. volume  status is improving. He had a mild bump in creatinine, likely related to diuresis. Lasix drip has been discontinued and he was started on Demadex. Repeat renal function in a.m. Beta blocker dose reduced due to bradycardia. Lisinopril discontinued due to acute kidney injury. 2-D echo with EF of 50-55%, no wall motion abnormality and unable to detect diastolic dysfunction. Continue metoprolol 25mg  bid.  Cardiology consult following.   Active Problems: Atrial fibrillation/flutter Status post ablation  for AVNRT. CHADS2vasc of 3. continue Eliquis and low dose BB.   Bradycardia Heart rate dropped to 20s on 10/12 while asleep. Metoprolol dose reduced to 12.5 mg twice a day. Heart rate improved, occasionally >110. Added nighttime CPAP for possible sleep apnea. He will need outpatient sleep study done. Heart rate currently stable.   Hypokalemia Replenished. magnesium normal.  Acute kidney injury Secondary to dehydration +/- cardiorenal syndrome. Holding lisinopril. Mild bump in creatinine today, likely related to diuretics.  COPD Stable. Continue home inhalers.  morbid obesity / ?OSA Needs counseling on diet and exercise.nighttime CPAP while in the hospital.  Tobacco abuse Smokes one pack per day.Counseled on cessation. Nicotine patch.     Code Status : full code  Family Communication  : none at bedside  Disposition Plan  : physical therapy has recommended skilled nursing facility placement, the patient has refused. He will return home with home health physical therapy, hopefully in a.m..   Barriers For Discharge : active symptoms  Consults  :  cardiology  Procedures   2-D echo: Left ventricle: The cavity size was normal. Systolic function was   normal. The estimated ejection fraction was in the range of 50%   to 55%. Wall motion was normal; there were no regional wall   motion abnormalities. The study is not technically sufficient to   allow evaluation of LV diastolic  function. - Mitral valve: Calcified annulus. Mildly thickened leaflets .   There was mild regurgitation. - Atrial septum: No defect or patent foramen ovale was identified. - Pulmonary arteries: PA peak pressure: 41 mm Hg (S).  DVT Prophylaxis  : eliquis  Lab Results  Component Value Date   PLT 228 09/15/2017    Antibiotics  :    Anti-infectives    None        Objective:   Vitals:   09/15/17 2041 09/15/17 2201 09/16/17 0400 09/16/17 0723  BP:  123/65 96/60   Pulse:  (!) 117 (!) 55   Resp:  16 20   Temp:  98.4 F (36.9 C) 99.2 F (37.3 C)   TempSrc:  Oral Oral   SpO2: 93%  95% 98%  Weight:   128 kg (282 lb 1.6 oz)   Height:        Wt Readings from Last 3 Encounters:  09/16/17 128 kg (282 lb 1.6 oz)  09/07/17 (!) 144.7 kg (319 lb)  01/15/10 (!) 138.3 kg (305 lb)     Intake/Output Summary (Last 24 hours) at 09/16/17 1520 Last data filed at 09/16/17 1053  Gross per 24 hour  Intake           595.21 ml  Output             1250 ml  Net          -654.79 ml     Physical Exam Gen.: Elderly male not in distress  HEENT: Moist mucous membranes, supple neck Chest: Decreased breath sounds over lung bases CVS: S1 and S2 irregular, no murmurs rubs or gallop GI: Soft, nondistended, nontender musculoskeletal: 2+ tense pitting edema bilaterally        Data Review:    CBC  Recent Labs Lab 09/11/17 0553 09/13/17 0542 09/15/17 0455  WBC 8.9 9.6 9.4  HGB 13.4 13.6 13.9  HCT 42.5 41.9 42.9  PLT 184 210 228  MCV 99.5 98.1 98.4  MCH 31.4 31.9 31.9  MCHC 31.5 32.5 32.4  RDW 16.0* 15.9* 15.1    Chemistries  Recent Labs Lab 09/11/17 0553 09/12/17 0650 09/13/17 0542 09/14/17 0422 09/15/17 0455 09/16/17 0601  NA 136 134* 134* 135 133* 132*  K 4.4 4.6 4.9 4.7 4.6 4.5  CL 92* 91* 92* 94* 93* 93*  CO2 34* 33* 31 31 29 28   GLUCOSE 94 88 97 92 97 105*  BUN 27* 41* 45* 39* 37* 48*  CREATININE 1.35* 1.98* 1.74* 1.50* 1.36* 1.68*  CALCIUM 9.4 9.1 9.5 9.5  9.7 9.4  MG 2.3  --   --   --   --   --    ------------------------------------------------------------------------------------------------------------------ No results for input(s): CHOL, HDL, LDLCALC, TRIG, CHOLHDL, LDLDIRECT in the last 72 hours.  No results found for: HGBA1C ------------------------------------------------------------------------------------------------------------------ No results for input(s): TSH, T4TOTAL, T3FREE, THYROIDAB in the last 72 hours.  Invalid input(s): FREET3 ------------------------------------------------------------------------------------------------------------------ No results for input(s): VITAMINB12, FOLATE, FERRITIN, TIBC, IRON, RETICCTPCT in the last 72 hours.  Coagulation profile No results for input(s): INR, PROTIME in the last 168 hours.  No results for input(s): DDIMER in the last 72 hours.  Cardiac Enzymes No results for input(s): CKMB, TROPONINI, MYOGLOBIN in the last 168 hours.  Invalid input(s): CK ------------------------------------------------------------------------------------------------------------------    Component Value Date/Time   BNP 747.0 (H) 09/07/2017 1527    Inpatient Medications  Scheduled Meds: . apixaban  5 mg Oral BID  . citalopram  20 mg Oral q morning - 10a  . famotidine  40 mg Oral QHS  . gabapentin  300 mg Oral TID  . LORazepam  1 mg Oral Q8H  . metoprolol tartrate  25 mg Oral BID  . nicotine  14 mg Transdermal Daily  . potassium chloride  40 mEq Oral BID  . sodium chloride flush  3 mL Intravenous Q12H  . torsemide  60 mg Oral Daily  . umeclidinium bromide  1 puff Inhalation Daily   Continuous Infusions: . sodium chloride     PRN Meds:.sodium chloride, acetaminophen, albuterol, ondansetron (ZOFRAN) IV, sodium chloride flush  Micro Results No results found for this or any previous visit (from the past 240 hour(s)).  Radiology Reports Dg Chest Portable 1 View  Result Date:  09/07/2017 CLINICAL DATA:  Fluid retention, shortness of breath, and irregular heartbeat. Current smoker. History of COPD, cardiac ablation, morbid obesity. EXAM: PORTABLE CHEST 1 VIEW COMPARISON:  Chest x-ray of May 23, 2017 FINDINGS: The cardiopericardial silhouette remains enlarged. The pulmonary vascularity is engorged and there is mild cephalization. The interstitial markings are both lungs are mildly increased. There is no pleural effusion. There is calcification in the wall of the aortic arch. The observed bony thorax is unremarkable. IMPRESSION: CHF with mild interstitial edema.  No alveolar pneumonia. Thoracic aortic atherosclerosis. Electronically Signed   By: David  Swazil[place]Jordan M.D.   On: 09/07/2017 15:53    Time Spent in minutes 35   MEMON,JEHANZEB M.D on 09/16/2017 at 3:20 PM  Between 7am to 7pm - Pager - [phone]  After 7pm go to [email or web address] - password [email or web address]  [facility] -  Office  [phone]

## 2017-09-16 NOTE — Care Management Important Message (Signed)
Important Message  Patient Details  Name: Edward Andrews MRN: [id number] Date of Birth: 1951/08/25   Medicare Important Message Given:  Yes    Malcolm MetroChildress, Sincere Liuzzi Demske, RN 09/16/2017, 3:14 PM

## 2017-09-16 NOTE — Progress Notes (Signed)
Progress Note  Patient Name: Edward Andrews Date of Encounter: 09/16/2017  Primary Cardiologist: Dr. Ermalene Searing  Subjective   Worked with PT yesterday, trying to do more but still weak. Feels better in terms of shortness of breath, no palpitations or chest pain.  Inpatient Medications    Scheduled Meds: . apixaban  5 mg Oral BID  . citalopram  20 mg Oral q morning - 10a  . famotidine  40 mg Oral QHS  . gabapentin  300 mg Oral TID  . LORazepam  1 mg Oral Q8H  . metoprolol tartrate  25 mg Oral BID  . nicotine  14 mg Transdermal Daily  . potassium chloride  40 mEq Oral BID  . sodium chloride flush  3 mL Intravenous Q12H  . umeclidinium bromide  1 puff Inhalation Daily   Continuous Infusions: . sodium chloride    . furosemide (LASIX) infusion 4 mg/hr (09/15/17 0011)   PRN Meds: sodium chloride, acetaminophen, albuterol, ondansetron (ZOFRAN) IV, sodium chloride flush   Vital Signs    Vitals:   09/15/17 2041 09/15/17 2201 09/16/17 0400 09/16/17 0723  BP:  123/65 96/60   Pulse:  (!) 117 (!) 55   Resp:  16 20   Temp:  98.4 F (36.9 C) 99.2 F (37.3 C)   TempSrc:  Oral Oral   SpO2: 93%  95% 98%  Weight:   282 lb 1.6 oz (128 kg)   Height:        Intake/Output Summary (Last 24 hours) at 09/16/17 0858 Last data filed at 09/16/17 0401  Gross per 24 hour  Intake           667.74 ml  Output             2175 ml  Net         -1507.26 ml   Filed Weights   09/14/17 0519 09/15/17 0605 09/16/17 0400  Weight: 295 lb 14.4 oz (134.2 kg) 286 lb 6.4 oz (129.9 kg) 282 lb 1.6 oz (128 kg)    Telemetry    Typical atrial flutter with variable conduction. Personally reviewed.  Physical Exam   GEN: Morbidly obese male. No acute distress.   Neck: No JVD. Cardiac:  Irregularly irregular, soft murmur, no gallop.  Respiratory: Nonlabored. No wheezing. GI: Obese, nontender, bowel sounds present. MS: 2+ leg edema; No deformity. Neuro:  Nonfocal. Psych: Alert and oriented x  3. Normal affect.  Labs    Chemistry Recent Labs Lab 09/14/17 0422 09/15/17 0455 09/16/17 0601  NA 135 133* 132*  K 4.7 4.6 4.5  CL 94* 93* 93*  CO2 31 29 28   GLUCOSE 92 97 105*  BUN 39* 37* 48*  CREATININE 1.50* 1.36* 1.68*  CALCIUM 9.5 9.7 9.4  GFRNONAA 46* 52* 40*  GFRAA 54* >60 47*  ANIONGAP 10 11 11      Hematology Recent Labs Lab 09/11/17 0553 09/13/17 0542 09/15/17 0455  WBC 8.9 9.6 9.4  RBC 4.27 4.27 4.36  HGB 13.4 13.6 13.9  HCT 42.5 41.9 42.9  MCV 99.5 98.1 98.4  MCH 31.4 31.9 31.9  MCHC 31.5 32.5 32.4  RDW 16.0* 15.9* 15.1  PLT 184 210 228    Radiology    No results found.  Cardiac Studies   Echocardiogram 09/08/2017: Study Conclusions  - Left ventricle: The cavity size was normal. Systolic function was normal. The estimated ejection fraction was in the range of 50% to 55%. Wall motion was normal; there were no regional wall  motion abnormalities. The study is not technically sufficient to allow evaluation of LV diastolic function. - Mitral valve: Calcified annulus. Mildly thickened leaflets . There was mild regurgitation. - Atrial septum: No defect or patent foramen ovale was identified. - Pulmonary arteries: PA peak pressure: 41 mm Hg (S).  Patient Profile     66 y.o. male with a history of nonobstructive CAD, PSVT status post ablation, hyperlipidemia, COPD, OSA, PAD with previous right common iliac stenting, now admitted to the hospital with acute on chronic diastolic heart failure as well as typical atrial flutter.  Assessment & Plan    1. Acute on chronic diastolic heart failure. Has had substantial weight loss since admission, now down in the low 280s. True baseline weight is uncertain. His urine output has started to drop off on Lasix infusion and creatinine bumped up from 1.3-1.6.  2. Typical atrial flutter with variable conduction. CHADSVASC score is 2-3. He continues on Eliquis and Lopressor.  3. Acute renal  insufficiency, creatinine 1.3-1.6.  4. Obesity with OSA.  Plan at this time will be to discontinue Lasix infusion and convert to oral Demadex with potassium supplement. Otherwise continue Eliquis and Lopressor. Still with functional limitations, PT ongoing. SNF was recommended but it does not sound like the patient wants to pursue this. Follow-up urine output and BMET in a.m. Looks to be nearing the point that cardiac status could be managed further as an outpatient (24-48 hours). May also need to be considered for elective cardioversion ultimately to see if getting him back in sinus rhythm will help further stabilize his diastolic heart failure.  Signed, Nona DellSamuel Dabney Dever, MD  09/16/2017, 8:58 AM

## 2017-09-17 LAB — BASIC METABOLIC PANEL
Anion gap: 13 (ref 5–15)
BUN: 63 mg/dL — AB (ref 6–20)
CHLORIDE: 94 mmol/L — AB (ref 101–111)
CO2: 27 mmol/L (ref 22–32)
Calcium: 9.7 mg/dL (ref 8.9–10.3)
Creatinine, Ser: 1.78 mg/dL — ABNORMAL HIGH (ref 0.61–1.24)
GFR calc Af Amer: 44 mL/min — ABNORMAL LOW (ref >60)
GFR calc non Af Amer: 38 mL/min — ABNORMAL LOW (ref >60)
GLUCOSE: 108 mg/dL — AB (ref 65–99)
POTASSIUM: 5.4 mmol/L — AB (ref 3.5–5.1)
Sodium: 134 mmol/L — ABNORMAL LOW (ref 135–145)

## 2017-09-17 MED ORDER — TORSEMIDE 20 MG PO TABS: 60.0000 mg | ORAL_TABLET | Freq: Every day | ORAL | 0 refills | Status: DC

## 2017-09-17 MED ORDER — APIXABAN 5 MG PO TABS: 5.0000 mg | ORAL_TABLET | Freq: Two times a day (BID) | ORAL | 1 refills | Status: AC

## 2017-09-17 MED ORDER — METOPROLOL TARTRATE 50 MG PO TABS: 25.0000 mg | ORAL_TABLET | Freq: Two times a day (BID) | ORAL | Status: AC

## 2017-09-17 NOTE — Discharge Summary (Signed)
Physician Discharge Summary  Edward Andrews UJW:[id number] DOB: 12-02-1950 DOA: 09/07/2017  PCP: Toma Deiters, MD  Admit date: 09/07/2017 Discharge date: 09/17/2017  Admitted From: home Disposition:  SNF was recommended, but patient refused and chose to go home  Recommendations for Outpatient Follow-up:  1. Follow up with PCP in 1-2 weeks 2. Please obtain BMP/CBC in one week 3. follow up with cardiology in 1 week.  4. Recommend outpatient sleep study to evaluate for sleep apnea. 5. Lisinopril on hold due to renal dysfunction.  Consider restarting as an outpatient  Home Health: HHPT, RN Equipment/Devices:  Discharge Condition:stable CODE STATUS: Full code Diet recommendation: Heart Healthy   Brief/Interim Summary: 66 year old male with history of anxiety, depression, OSA not on CPAP, COPD, hyperlipidemia, history of AVNRT status post ablation in 2010 and peripheral artery disease presented from cardiology office with acute CHF exacerbation. Patient reported increased swelling of his legs associated with shortness of breath and weakness.symptoms have been ongoing for 6-8 weeks and worsening for the past 1 week.reports 2 pillow orthopnea but no PND.  Discharge Diagnoses:  Principal Problem:   Acute on chronic combined systolic and diastolic CHF (congestive heart failure) (HCC) Active Problems:   OBESITY-MORBID (>100')   TOBACCO ABUSE   CHF exacerbation (HCC)   Atrial flutter with controlled response (HCC)   Hypokalemia   Back pain   Neck pain   AKI (acute kidney injury) (HCC)  Acute on chronic combined systolic and diastolic CHF. Noticed to be over by almost 30 pounds on admissionand being diuresed aggressively. (Having difficulty weighing him as patient sits on the recliner and nursing staff having difficulty getting him onto the bed or stand him up to weight him)  Was receiving IV Lasix 80 mg twice a day, switched to Lasix drip given persistent leg swelling(since  10/15) Patient had excellent diuresis. Net volume status since admission is -24.9 L.He had a mild bump in creatinine, likely related to diuresis. Lasix drip was then discontinued and he was started on Demadex.  Renal function has been stable.. Beta blocker dose reduced due to bradycardia. Lisinopril discontinued due to acute kidney injury. 2-D echo with EF of 50-55%, no wall motion abnormality and unable to detect diastolic dysfunction. Continue metoprolol 25mg  bid.  This has been transitioned to oral diuretics, plan will be to discharge the patient home and follow-up with cardiology as an outpatient.  He does not have any shortness of breath at this time.    Active Problems: Atrial fibrillation/flutter Status post ablation for AVNRT. CHADS2vasc of 3. continue Eliquis and low dose BB.   Bradycardia Heart rate dropped to 20s on 10/12 while asleep. Metoprolol dose reduced to 12.5 mg twice a day. Heart rate improved, occasionally >110. Added nighttime CPAP for possible sleep apnea. He will need outpatient sleep study done. Heart rate currently stable.   Hypokalemia Replenished. magnesium normal.  Acute kidney injury Secondary to dehydration +/- cardiorenal syndrome. Holding lisinopril. Mild bump in creatinine today, likely related to diuretics.  COPD Stable. Continue home inhalers.  morbid obesity / ?OSA Needs counseling on diet and exercise.nighttime CPAP while in the hospital.  Tobacco abuse Smokes one pack per day.Counseled on cessation. Nicotine patch  Discharge Instructions  Discharge Instructions    Diet - low sodium heart healthy    Complete by:  As directed    Increase activity slowly    Complete by:  As directed      Allergies as of 09/17/2017   No Known Allergies  Medication List    STOP taking these medications   furosemide 40 MG tablet Commonly known as:  LASIX     TAKE these medications   albuterol 0.63 MG/3ML nebulizer solution Commonly  known as:  ACCUNEB Take 1 ampule by nebulization every 6 (six) hours as needed for wheezing.   albuterol 108 (90 Base) MCG/ACT inhaler Commonly known as:  PROVENTIL HFA;VENTOLIN HFA Inhale into the lungs every 6 (six) hours as needed for wheezing or shortness of breath.   allopurinol 300 MG tablet Commonly known as:  ZYLOPRIM Take 300 mg by mouth daily as needed (for joint pain).   apixaban 5 MG Tabs tablet Commonly known as:  ELIQUIS Take 1 tablet (5 mg total) by mouth 2 (two) times daily.   citalopram 20 MG tablet Commonly known as:  CELEXA TAKE 1 TABLET BY MOUTH EVERY MORNING   gabapentin 300 MG capsule Commonly known as:  NEURONTIN Take 300 mg by mouth 3 (three) times daily.   INCRUSE ELLIPTA 62.5 MCG/INH Aepb Generic drug:  umeclidinium bromide Inhale 1 puff into the lungs daily.   LORazepam 1 MG tablet Commonly known as:  ATIVAN Take 1 mg by mouth every 8 (eight) hours.   metoprolol tartrate 50 MG tablet Commonly known as:  LOPRESSOR Take 0.5 tablets (25 mg total) by mouth 2 (two) times daily. What changed:  how much to take   nystatin powder Commonly known as:  MYCOSTATIN/NYSTOP Apply topically daily as needed (for rash).   potassium chloride 10 MEQ tablet Commonly known as:  K-DUR Take 10 mEq by mouth daily.   ranitidine 300 MG capsule Commonly known as:  ZANTAC Take 300 mg by mouth every evening.   torsemide 20 MG tablet Commonly known as:  DEMADEX Take 3 tablets (60 mg total) by mouth daily. Start on 9/23      Follow-up Information    [facility].   Contact information: [address] [place] [address] [phone]          No Known Allergies  Consultations:  Cardiology   Procedures/Studies: Dg Chest Portable 1 View  Result Date: 09/07/2017 CLINICAL DATA:  Fluid retention, shortness of breath, and irregular heartbeat. Current smoker. History of COPD, cardiac ablation, morbid obesity. EXAM:  PORTABLE CHEST 1 VIEW COMPARISON:  Chest x-ray of May 23, 2017 FINDINGS: The cardiopericardial silhouette remains enlarged. The pulmonary vascularity is engorged and there is mild cephalization. The interstitial markings are both lungs are mildly increased. There is no pleural effusion. There is calcification in the wall of the aortic arch. The observed bony thorax is unremarkable. IMPRESSION: CHF with mild interstitial edema.  No alveolar pneumonia. Thoracic aortic atherosclerosis. Electronically Signed   By: David  [place] M.D.   On: 09/07/2017 15:53    Echo:Left ventricle: The cavity size was normal. Systolic function was normal. The estimated ejection fraction was in the range of 50% to 55%. Wall motion was normal; there were no regional wall motion abnormalities. The study is not technically sufficient to allow evaluation of LV diastolic function. - Mitral valve: Calcified annulus. Mildly thickened leaflets . There was mild regurgitation. - Atrial septum: No defect or patent foramen ovale was identified. - Pulmonary arteries: PA peak pressure: 41 mm Hg (S).   Subjective: No shortness of breath.  Overall feeling better.  Discharge Exam: Vitals:   09/17/17 0428 09/17/17 0739  BP: 121/78   Pulse: 61   Resp:    Temp: 97.6 F (36.4 C)  SpO2: 100% 98%   Vitals:   09/16/17 2101 09/17/17 0428 09/17/17 0500 09/17/17 0739  BP: 122/79 121/78    Pulse: 62 61    Resp: 16     Temp: (!) 97.4 F (36.3 C) 97.6 F (36.4 C)    TempSrc: Oral Axillary    SpO2: 97% 100%  98%  Weight:   127.5 kg (281 lb)   Height:        General: Pt is alert, awake, not in acute distress Cardiovascular: RRR, S1/S2 +, no rubs, no gallops Respiratory: CTA bilaterally, no wheezing, no rhonchi Abdominal: Soft, NT, ND, bowel sounds + Extremities: 1+ edema, no cyanosis    The results of significant diagnostics from this hospitalization (including imaging, microbiology, ancillary and laboratory)  are listed below for reference.     Microbiology: No results found for this or any previous visit (from the past 240 hour(s)).   Labs: BNP (last 3 results)  Recent Labs  09/07/17 1527  BNP 747.0*   Basic Metabolic Panel:  Recent Labs Lab 09/11/17 0553  09/13/17 0542 09/14/17 0422 09/15/17 0455 09/16/17 0601 09/17/17 0700  NA 136  < > 134* 135 133* 132* 134*  K 4.4  < > 4.9 4.7 4.6 4.5 5.4*  CL 92*  < > 92* 94* 93* 93* 94*  CO2 34*  < > 31 31 29 28 27   GLUCOSE 94  < > 97 92 97 105* 108*  BUN 27*  < > 45* 39* 37* 48* 63*  CREATININE 1.35*  < > 1.74* 1.50* 1.36* 1.68* 1.78*  CALCIUM 9.4  < > 9.5 9.5 9.7 9.4 9.7  MG 2.3  --   --   --   --   --   --   < > = values in this interval not displayed. Liver Function Tests: No results for input(s): AST, ALT, ALKPHOS, BILITOT, PROT, ALBUMIN in the last 168 hours. No results for input(s): LIPASE, AMYLASE in the last 168 hours. No results for input(s): AMMONIA in the last 168 hours. CBC:  Recent Labs Lab 09/11/17 0553 09/13/17 0542 09/15/17 0455  WBC 8.9 9.6 9.4  HGB 13.4 13.6 13.9  HCT 42.5 41.9 42.9  MCV 99.5 98.1 98.4  PLT 184 210 228   Cardiac Enzymes: No results for input(s): CKTOTAL, CKMB, CKMBINDEX, TROPONINI in the last 168 hours. BNP: Invalid input(s): POCBNP CBG: No results for input(s): GLUCAP in the last 168 hours. D-Dimer No results for input(s): DDIMER in the last 72 hours. Hgb A1c No results for input(s): HGBA1C in the last 72 hours. Lipid Profile No results for input(s): CHOL, HDL, LDLCALC, TRIG, CHOLHDL, LDLDIRECT in the last 72 hours. Thyroid function studies No results for input(s): TSH, T4TOTAL, T3FREE, THYROIDAB in the last 72 hours.  Invalid input(s): FREET3 Anemia work up No results for input(s): VITAMINB12, FOLATE, FERRITIN, TIBC, IRON, RETICCTPCT in the last 72 hours. Urinalysis No results found for: COLORURINE, APPEARANCEUR, LABSPEC, PHURINE, GLUCOSEU, HGBUR, BILIRUBINUR, KETONESUR,  PROTEINUR, UROBILINOGEN, NITRITE, LEUKOCYTESUR Sepsis Labs Invalid input(s): PROCALCITONIN,  WBC,  LACTICIDVEN Microbiology No results found for this or any previous visit (from the past 240 hour(s)).   Time coordinating discharge: Over 30 minutes  SIGNED:   Erick BlinksMEMON,Korrin Waterfield, MD  [facility] 09/17/2017, 1:05 PM Pager   If 7PM-7AM, please contact night-coverage [email or web address] Password [email or web address]

## 2017-09-17 NOTE — Progress Notes (Addendum)
Pt IV removed per NT, tolerated well.  Telemetry removed.  Reviewed discharge instructions with pt and family at bedside.  Answered questions at this time.    [facility] and [facility] are aware of pt D/C

## 2017-09-19 DIAGNOSIS — I110 Hypertensive heart disease with heart failure: Secondary | ICD-10-CM | POA: Diagnosis not present

## 2017-09-19 DIAGNOSIS — F329 Major depressive disorder, single episode, unspecified: Secondary | ICD-10-CM | POA: Diagnosis not present

## 2017-09-19 DIAGNOSIS — I5043 Acute on chronic combined systolic (congestive) and diastolic (congestive) heart failure: Secondary | ICD-10-CM | POA: Diagnosis not present

## 2017-09-19 DIAGNOSIS — J449 Chronic obstructive pulmonary disease, unspecified: Secondary | ICD-10-CM | POA: Diagnosis not present

## 2017-09-19 DIAGNOSIS — Z6841 Body Mass Index (BMI) 40.0 and over, adult: Secondary | ICD-10-CM | POA: Diagnosis not present

## 2017-09-19 DIAGNOSIS — J441 Chronic obstructive pulmonary disease with (acute) exacerbation: Secondary | ICD-10-CM | POA: Diagnosis not present

## 2017-09-19 DIAGNOSIS — G4733 Obstructive sleep apnea (adult) (pediatric): Secondary | ICD-10-CM | POA: Diagnosis not present

## 2017-09-19 DIAGNOSIS — I739 Peripheral vascular disease, unspecified: Secondary | ICD-10-CM | POA: Diagnosis not present

## 2017-09-19 DIAGNOSIS — I4892 Unspecified atrial flutter: Secondary | ICD-10-CM | POA: Diagnosis not present

## 2017-09-19 DIAGNOSIS — I480 Paroxysmal atrial fibrillation: Secondary | ICD-10-CM | POA: Diagnosis not present

## 2017-09-21 ENCOUNTER — Ambulatory Visit (INDEPENDENT_AMBULATORY_CARE_PROVIDER_SITE_OTHER): Payer: Medicare HMO | Admitting: Cardiovascular Disease

## 2017-09-21 ENCOUNTER — Encounter: Payer: Self-pay | Admitting: Cardiovascular Disease

## 2017-09-21 VITALS — BP 120/80 | HR 73 | Ht 72.0 in | Wt 291.2 lb

## 2017-09-21 DIAGNOSIS — I10 Essential (primary) hypertension: Secondary | ICD-10-CM

## 2017-09-21 DIAGNOSIS — I4892 Unspecified atrial flutter: Secondary | ICD-10-CM

## 2017-09-21 DIAGNOSIS — I739 Peripheral vascular disease, unspecified: Secondary | ICD-10-CM

## 2017-09-21 DIAGNOSIS — Z720 Tobacco use: Secondary | ICD-10-CM

## 2017-09-21 DIAGNOSIS — R600 Localized edema: Secondary | ICD-10-CM

## 2017-09-21 DIAGNOSIS — I5032 Chronic diastolic (congestive) heart failure: Secondary | ICD-10-CM

## 2017-09-21 DIAGNOSIS — Z9289 Personal history of other medical treatment: Secondary | ICD-10-CM

## 2017-09-21 DIAGNOSIS — Z79899 Other long term (current) drug therapy: Secondary | ICD-10-CM | POA: Diagnosis not present

## 2017-09-21 NOTE — Patient Instructions (Signed)
Medication Instructions:  Your physician recommends that you continue on your current medications as directed. Please refer to the Current Medication list given to you today.   Labwork: 1 MONTH  BMET  Testing/Procedures: NONE  Follow-Up: Your physician recommends that you schedule a follow-up appointment in: 2 MONTHS    Any Other Special Instructions Will Be Listed Below (If Applicable).     If you need a refill on your cardiac medications before your next appointment, please call your pharmacy.

## 2017-09-21 NOTE — Progress Notes (Signed)
SUBJECTIVE: The patient presents for follow-up after being hospitalized for acute diastolic heart failure and atrial flutter.  Weight is down to 291 pounds from 319 pounds at the time of his initial office visit with me on 09/07/17.  By hospital scales, he went from 322 pounds to 282 pounds.  Complaints today related to headaches, blurry vision, and dizziness. He denies bleeding problems. He denies chest pain, shortness of breath, leg swelling. He has cut back to smoking less than a pack of cigarettes daily.     Review of Systems: As per "subjective", otherwise negative.  No Known Allergies  Current Outpatient Prescriptions  Medication Sig Dispense Refill  . albuterol (ACCUNEB) 0.63 MG/3ML nebulizer solution Take 1 ampule by nebulization every 6 (six) hours as needed for wheezing.    Marland Kitchen. albuterol (PROVENTIL HFA;VENTOLIN HFA) 108 (90 Base) MCG/ACT inhaler Inhale into the lungs every 6 (six) hours as needed for wheezing or shortness of breath.    . allopurinol (ZYLOPRIM) 300 MG tablet Take 300 mg by mouth daily as needed (for joint pain).     Marland Kitchen. apixaban (ELIQUIS) 5 MG TABS tablet Take 1 tablet (5 mg total) by mouth 2 (two) times daily. 60 tablet 1  . citalopram (CELEXA) 20 MG tablet TAKE 1 TABLET BY MOUTH EVERY MORNING 15 tablet 0  . gabapentin (NEURONTIN) 300 MG capsule Take 300 mg by mouth 3 (three) times daily.    Marland Kitchen. LORazepam (ATIVAN) 1 MG tablet Take 1 mg by mouth every 8 (eight) hours.    . metoprolol tartrate (LOPRESSOR) 50 MG tablet Take 0.5 tablets (25 mg total) by mouth 2 (two) times daily.    Marland Kitchen. nystatin (MYCOSTATIN/NYSTOP) powder Apply topically daily as needed (for rash).     . potassium chloride (K-DUR) 10 MEQ tablet Take 10 mEq by mouth daily.    . ranitidine (ZANTAC) 300 MG capsule Take 300 mg by mouth every evening.    . torsemide (DEMADEX) 20 MG tablet Take 3 tablets (60 mg total) by mouth daily. Start on 9/23 90 tablet 0  . umeclidinium bromide (INCRUSE ELLIPTA)  62.5 MCG/INH AEPB Inhale 1 puff into the lungs daily.     No current facility-administered medications for this visit.     Past Medical History:  Diagnosis Date  . Anxiety and depression   . AVNRT (AV nodal re-entry tachycardia) (HCC)    Status post ablation 2010  . COPD (chronic obstructive pulmonary disease) (HCC)   . GERD (gastroesophageal reflux disease)   . History of nephrolithiasis   . Mixed hyperlipidemia   . Obstructive sleep apnea    does not wear CPAP  . Panic disorder   . Peripheral arterial disease (HCC)    Status post PTCA/stent to right common iliac artery    Past Surgical History:  Procedure Laterality Date  . BACK SURGERY    . CARDIAC ELECTROPHYSIOLOGY MAPPING AND ABLATION    . CORONARY ANGIOPLASTY WITH STENT PLACEMENT    . Kidney stone removal      Social History   Social History  . Marital status: Married    Spouse name: N/A  . Number of children: N/A  . Years of education: N/A   Occupational History  . retired    Social History Main Topics  . Smoking status: Current Every Day Smoker    Packs/day: 1.00    Years: 43.00    Types: Cigarettes  . Smokeless tobacco: Never Used  . Alcohol use No  . Drug  use: No  . Sexual activity: Not on file   Other Topics Concern  . Not on file   Social History Narrative  . No narrative on file     Vitals:   09/21/17 1401  BP: 120/80  Pulse: 73  SpO2: 98%  Weight: 291 lb 3.2 oz (132.1 kg)  Height: 6' (1.829 m)    Wt Readings from Last 3 Encounters:  09/21/17 291 lb 3.2 oz (132.1 kg)  09/17/17 281 lb (127.5 kg)  09/07/17 (!) 319 lb (144.7 kg)     PHYSICAL EXAM General: NAD HEENT: Normal. Neck: No JVD, no thyromegaly. Lungs: Clear to auscultation bilaterally with normal respiratory effort. CV: Regular rate and irregular rhythm, normal S1/S2, no S3, no murmur. Trace bilateral leg edema.   Abdomen: Soft, nontender, no distention.  Neurologic: Alert and oriented.  Psych: Normal affect. Skin:  Normal. Musculoskeletal: No gross deformities.    ECG: Most recent ECG reviewed.   Labs: Lab Results  Component Value Date/Time   K 5.4 (H) 09/17/2017 07:00 AM   BUN 63 (H) 09/17/2017 07:00 AM   CREATININE 1.78 (H) 09/17/2017 07:00 AM   TSH 1.345 09/07/2017 03:27 PM   HGB 13.9 09/15/2017 04:55 AM     Lipids: No results found for: LDLCALC, LDLDIRECT, CHOL, TRIG, HDL     ASSESSMENT AND PLAN: 1. Chronic diastolic heart failure: Euvolemic. Blood pressure is controlled. 28 pound weight loss since initial visit with me on 09/07/17 as per office scales, 40 lb weight loss by hospital scales. Continue torsemide 60 mg daily with supplemental potassium. I will obtain a basic metabolic panel in one month.  2. Atrial flutter: Heart rate is controlled on metoprolol tartrate 12.5 twice daily. Anticoagulated with Eliquis. No changes to therapy.  3. Peripheral vascular disease: I will initiate statin therapy at his next visit. He is on Eliquis so I will avoid aspirin.  4. Hypertension: Controlled on present therapy. No changes.  5. Tobacco abuse: Cessation counseling previously provided. He has cut back to less than a pack of cigarettes daily.    Disposition: Follow up 2 months.   Prentice Docker, M.D., F.A.C.C.

## 2017-09-22 ENCOUNTER — Other Ambulatory Visit: Payer: Self-pay

## 2017-09-22 ENCOUNTER — Ambulatory Visit: Payer: Medicare HMO | Admitting: Cardiovascular Disease

## 2017-09-22 DIAGNOSIS — Z6841 Body Mass Index (BMI) 40.0 and over, adult: Secondary | ICD-10-CM | POA: Diagnosis not present

## 2017-09-22 DIAGNOSIS — F329 Major depressive disorder, single episode, unspecified: Secondary | ICD-10-CM | POA: Diagnosis not present

## 2017-09-22 DIAGNOSIS — I110 Hypertensive heart disease with heart failure: Secondary | ICD-10-CM | POA: Diagnosis not present

## 2017-09-22 DIAGNOSIS — I4892 Unspecified atrial flutter: Secondary | ICD-10-CM | POA: Diagnosis not present

## 2017-09-22 DIAGNOSIS — G4733 Obstructive sleep apnea (adult) (pediatric): Secondary | ICD-10-CM | POA: Diagnosis not present

## 2017-09-22 DIAGNOSIS — I739 Peripheral vascular disease, unspecified: Secondary | ICD-10-CM | POA: Diagnosis not present

## 2017-09-22 DIAGNOSIS — J449 Chronic obstructive pulmonary disease, unspecified: Secondary | ICD-10-CM | POA: Diagnosis not present

## 2017-09-22 DIAGNOSIS — I5043 Acute on chronic combined systolic (congestive) and diastolic (congestive) heart failure: Secondary | ICD-10-CM | POA: Diagnosis not present

## 2017-09-22 NOTE — Patient Outreach (Addendum)
[facility]) Care Management  09/22/2017  [facility] 12-Apr-1951 [id number]     EMMI-CHF RED ON EMMI ALERT Day # 2 Date: 09/21/17 Red Alert Reason: "Any new problems? Yes  New/worsening problems? Yes  New swelling? Yes   New/worsening SOB? Yes   Had diarrhea or felt sick to stomach? Yes  Nausea and vomiting? Yes  Tired/fatigued? Yes  Lightheaded or dizzy? Yes   Fever or chills? Yes   Other symptoms/problems? Yes"   Outreach attempt # 1 to patient. Spoke with patient. Reviewed and addressed red alerts. Patient states machine misunderstood all his responses. He states he answered no to the questions but the machine recorded yes as response. He denies any new/worsening issues. He states he has been doing well since discharge home. He denies any edema. He voices swelling is gone. His weight is stable. He is weighing daily. RN CM reviewed with patient when to alert MD of changes in weight. He voices that he had f/u appt with cardiologist on yesterday and got a good report. He goes to see PCP today. No issues with transportation. He has all his meds and understands when,how and why to take them. He voices that he spoke with MD on yesterday regarding his diuretic med or Neurontin possibly causing some dizziness and blurred vision. MD told him to monitor symptoms and that they should improve. RN CM advised patient that if symptoms worsen and/or unimproved to alert MD. He voiced understanding. Patient denies any RN CM needs or concerns at this time. Advised patient that they would continue to get automated EMMI- HF post discharge calls to assess how they are doing following recent hospitalization and will receive a call from a nurse if any of their responses were abnormal. Patient voiced understanding and was appreciative of f/u call.      Plan: RN CM will [facility] of case status.  RN CM will send HF educational info and magnet to patient via  mail.  Antionette Fairyoshanda Kalmen Lollar, RN,BSN,CCM [facility] Care [facility] Telephonic Care Management Coordinator Direct Phone: [phone] Toll Free: [phone] Fax: [phone]

## 2017-09-23 ENCOUNTER — Other Ambulatory Visit: Payer: Self-pay

## 2017-09-23 DIAGNOSIS — G4733 Obstructive sleep apnea (adult) (pediatric): Secondary | ICD-10-CM | POA: Diagnosis not present

## 2017-09-23 DIAGNOSIS — F329 Major depressive disorder, single episode, unspecified: Secondary | ICD-10-CM | POA: Diagnosis not present

## 2017-09-23 DIAGNOSIS — J449 Chronic obstructive pulmonary disease, unspecified: Secondary | ICD-10-CM | POA: Diagnosis not present

## 2017-09-23 DIAGNOSIS — I4892 Unspecified atrial flutter: Secondary | ICD-10-CM | POA: Diagnosis not present

## 2017-09-23 DIAGNOSIS — I110 Hypertensive heart disease with heart failure: Secondary | ICD-10-CM | POA: Diagnosis not present

## 2017-09-23 DIAGNOSIS — I5043 Acute on chronic combined systolic (congestive) and diastolic (congestive) heart failure: Secondary | ICD-10-CM | POA: Diagnosis not present

## 2017-09-23 DIAGNOSIS — Z6841 Body Mass Index (BMI) 40.0 and over, adult: Secondary | ICD-10-CM | POA: Diagnosis not present

## 2017-09-23 DIAGNOSIS — I739 Peripheral vascular disease, unspecified: Secondary | ICD-10-CM | POA: Diagnosis not present

## 2017-09-23 NOTE — Patient Outreach (Signed)
[facility]) Care Management  09/23/2017  [facility] Apr 22, 1951 [id number]     EMMI-HF RED ON EMMI ALERT Day # 3 Date: 09/22/17 Red Alert Reason: "New worsening problems? Yes  New swelling? Yes   New/worsening SOB? Yes"   Red on EMMI dashboard received. No outreach call warranted to patient at this time. RN CM addressed issue on previous call.     Plan: RN CM will notify [facility] administrative assistant of case status.    Edward Fairyoshanda [name], RN,BSN,CCM [facility] Care Management Telephonic Care Management Coordinator Direct Phone: [phone] Toll Free: [phone] Fax: [phone]

## 2017-09-26 DIAGNOSIS — G4733 Obstructive sleep apnea (adult) (pediatric): Secondary | ICD-10-CM | POA: Diagnosis not present

## 2017-09-26 DIAGNOSIS — J449 Chronic obstructive pulmonary disease, unspecified: Secondary | ICD-10-CM | POA: Diagnosis not present

## 2017-09-26 DIAGNOSIS — Z6841 Body Mass Index (BMI) 40.0 and over, adult: Secondary | ICD-10-CM | POA: Diagnosis not present

## 2017-09-26 DIAGNOSIS — I739 Peripheral vascular disease, unspecified: Secondary | ICD-10-CM | POA: Diagnosis not present

## 2017-09-26 DIAGNOSIS — F329 Major depressive disorder, single episode, unspecified: Secondary | ICD-10-CM | POA: Diagnosis not present

## 2017-09-26 DIAGNOSIS — I5041 Acute combined systolic (congestive) and diastolic (congestive) heart failure: Secondary | ICD-10-CM | POA: Diagnosis not present

## 2017-09-26 DIAGNOSIS — I110 Hypertensive heart disease with heart failure: Secondary | ICD-10-CM | POA: Diagnosis not present

## 2017-09-26 DIAGNOSIS — J440 Chronic obstructive pulmonary disease with acute lower respiratory infection: Secondary | ICD-10-CM | POA: Diagnosis not present

## 2017-09-26 DIAGNOSIS — I4892 Unspecified atrial flutter: Secondary | ICD-10-CM | POA: Diagnosis not present

## 2017-09-26 DIAGNOSIS — I5043 Acute on chronic combined systolic (congestive) and diastolic (congestive) heart failure: Secondary | ICD-10-CM | POA: Diagnosis not present

## 2017-09-26 DIAGNOSIS — Z6838 Body mass index (BMI) 38.0-38.9, adult: Secondary | ICD-10-CM | POA: Diagnosis not present

## 2017-09-27 ENCOUNTER — Other Ambulatory Visit: Payer: Self-pay

## 2017-09-27 DIAGNOSIS — I4892 Unspecified atrial flutter: Secondary | ICD-10-CM | POA: Diagnosis not present

## 2017-09-27 DIAGNOSIS — I110 Hypertensive heart disease with heart failure: Secondary | ICD-10-CM | POA: Diagnosis not present

## 2017-09-27 DIAGNOSIS — F329 Major depressive disorder, single episode, unspecified: Secondary | ICD-10-CM | POA: Diagnosis not present

## 2017-09-27 DIAGNOSIS — G4733 Obstructive sleep apnea (adult) (pediatric): Secondary | ICD-10-CM | POA: Diagnosis not present

## 2017-09-27 DIAGNOSIS — J449 Chronic obstructive pulmonary disease, unspecified: Secondary | ICD-10-CM | POA: Diagnosis not present

## 2017-09-27 DIAGNOSIS — I739 Peripheral vascular disease, unspecified: Secondary | ICD-10-CM | POA: Diagnosis not present

## 2017-09-27 DIAGNOSIS — I5043 Acute on chronic combined systolic (congestive) and diastolic (congestive) heart failure: Secondary | ICD-10-CM | POA: Diagnosis not present

## 2017-09-27 DIAGNOSIS — Z6841 Body Mass Index (BMI) 40.0 and over, adult: Secondary | ICD-10-CM | POA: Diagnosis not present

## 2017-09-27 NOTE — Patient Outreach (Signed)
[facility]) Care Management  09/27/2017  [facility] [birth year] [id number]     EMMI-HF RED ON EMMI ALERT Day # 7 Date: 09/26/17 Red Alert Reason: " Any new problems? Yes  New/worsening problems? Yes       New swelling? Yes    New/worsening SOB? Yes   Had diarrhea or felt sick to stomach? Yes      Nausea or vomiting? Yes   Tired/fatigued? Yes  Lightheaded or dizzy? Yes      Fever or chills? Yes       Other symptoms/problems? Yes"     Outreach attempt #1 to patient.No answer. RN CM left HIPAA compliant voicemail message along with contact info.       Plan: RN CM will make outreach attempt to patient within one business day if no return call.    Antionette Fairyoshanda Loanne Emery, RN,BSN,CCM [facility] Care [facility] Telephonic Care Management Coordinator Direct Phone: [phone] Toll Free: [phone] Fax: [phone]

## 2017-09-28 ENCOUNTER — Other Ambulatory Visit: Payer: Self-pay

## 2017-09-28 DIAGNOSIS — I739 Peripheral vascular disease, unspecified: Secondary | ICD-10-CM | POA: Diagnosis not present

## 2017-09-28 DIAGNOSIS — I5043 Acute on chronic combined systolic (congestive) and diastolic (congestive) heart failure: Secondary | ICD-10-CM | POA: Diagnosis not present

## 2017-09-28 DIAGNOSIS — Z6841 Body Mass Index (BMI) 40.0 and over, adult: Secondary | ICD-10-CM | POA: Diagnosis not present

## 2017-09-28 DIAGNOSIS — I4892 Unspecified atrial flutter: Secondary | ICD-10-CM | POA: Diagnosis not present

## 2017-09-28 DIAGNOSIS — I110 Hypertensive heart disease with heart failure: Secondary | ICD-10-CM | POA: Diagnosis not present

## 2017-09-28 DIAGNOSIS — F329 Major depressive disorder, single episode, unspecified: Secondary | ICD-10-CM | POA: Diagnosis not present

## 2017-09-28 DIAGNOSIS — J449 Chronic obstructive pulmonary disease, unspecified: Secondary | ICD-10-CM | POA: Diagnosis not present

## 2017-09-28 DIAGNOSIS — G4733 Obstructive sleep apnea (adult) (pediatric): Secondary | ICD-10-CM | POA: Diagnosis not present

## 2017-09-28 NOTE — Patient Outreach (Signed)
[facility]) Care Management  09/28/2017  [facility] 1951-07-01 [id number]   EMMI-HF RED ON EMMI ALERT Day # 7 Date: 09/26/17 Red Alert Reason: " Any new problems? Yes  New/worsening problems? Yes       New swelling? Yes    New/worsening SOB? Yes   Had diarrhea or felt sick to stomach? Yes      Nausea or vomiting? Yes   Tired/fatigued? Yes  Lightheaded or dizzy? Yes      Fever or chills? Yes       Other symptoms/problems? Yes"    Outreach attempt #2 to patient. Spoke with patient. He states he is doing fine. He voices that machine again heard him incorrectly and recorded yes to all responses when answer should have been no. He denies any new and/or worsening symptoms. He voices that his weight is stable and he is doing fine. No further RN CM needs or concerns at this time. Patient appreciative of f/u call to check on him.     Plan: RN CM will notify [facility] of case status.    Antionette Fairyoshanda Ciara Kagan, RN,BSN,CCM [facility] Care Management Telephonic Care Management Coordinator Direct Phone: [phone] Toll Free: [phone] Fax: [phone]

## 2017-09-29 DIAGNOSIS — I739 Peripheral vascular disease, unspecified: Secondary | ICD-10-CM | POA: Diagnosis not present

## 2017-09-29 DIAGNOSIS — I110 Hypertensive heart disease with heart failure: Secondary | ICD-10-CM | POA: Diagnosis not present

## 2017-09-29 DIAGNOSIS — I5043 Acute on chronic combined systolic (congestive) and diastolic (congestive) heart failure: Secondary | ICD-10-CM | POA: Diagnosis not present

## 2017-09-29 DIAGNOSIS — F329 Major depressive disorder, single episode, unspecified: Secondary | ICD-10-CM | POA: Diagnosis not present

## 2017-09-29 DIAGNOSIS — I4892 Unspecified atrial flutter: Secondary | ICD-10-CM | POA: Diagnosis not present

## 2017-09-29 DIAGNOSIS — G4733 Obstructive sleep apnea (adult) (pediatric): Secondary | ICD-10-CM | POA: Diagnosis not present

## 2017-09-29 DIAGNOSIS — J449 Chronic obstructive pulmonary disease, unspecified: Secondary | ICD-10-CM | POA: Diagnosis not present

## 2017-09-29 DIAGNOSIS — Z6841 Body Mass Index (BMI) 40.0 and over, adult: Secondary | ICD-10-CM | POA: Diagnosis not present

## 2017-09-30 DIAGNOSIS — F329 Major depressive disorder, single episode, unspecified: Secondary | ICD-10-CM | POA: Diagnosis not present

## 2017-09-30 DIAGNOSIS — I739 Peripheral vascular disease, unspecified: Secondary | ICD-10-CM | POA: Diagnosis not present

## 2017-09-30 DIAGNOSIS — I4892 Unspecified atrial flutter: Secondary | ICD-10-CM | POA: Diagnosis not present

## 2017-09-30 DIAGNOSIS — I5043 Acute on chronic combined systolic (congestive) and diastolic (congestive) heart failure: Secondary | ICD-10-CM | POA: Diagnosis not present

## 2017-09-30 DIAGNOSIS — Z6841 Body Mass Index (BMI) 40.0 and over, adult: Secondary | ICD-10-CM | POA: Diagnosis not present

## 2017-09-30 DIAGNOSIS — J449 Chronic obstructive pulmonary disease, unspecified: Secondary | ICD-10-CM | POA: Diagnosis not present

## 2017-09-30 DIAGNOSIS — I110 Hypertensive heart disease with heart failure: Secondary | ICD-10-CM | POA: Diagnosis not present

## 2017-09-30 DIAGNOSIS — G4733 Obstructive sleep apnea (adult) (pediatric): Secondary | ICD-10-CM | POA: Diagnosis not present

## 2017-10-03 DIAGNOSIS — I5043 Acute on chronic combined systolic (congestive) and diastolic (congestive) heart failure: Secondary | ICD-10-CM | POA: Diagnosis not present

## 2017-10-03 DIAGNOSIS — G4733 Obstructive sleep apnea (adult) (pediatric): Secondary | ICD-10-CM | POA: Diagnosis not present

## 2017-10-03 DIAGNOSIS — I739 Peripheral vascular disease, unspecified: Secondary | ICD-10-CM | POA: Diagnosis not present

## 2017-10-03 DIAGNOSIS — Z6841 Body Mass Index (BMI) 40.0 and over, adult: Secondary | ICD-10-CM | POA: Diagnosis not present

## 2017-10-03 DIAGNOSIS — F329 Major depressive disorder, single episode, unspecified: Secondary | ICD-10-CM | POA: Diagnosis not present

## 2017-10-03 DIAGNOSIS — I4892 Unspecified atrial flutter: Secondary | ICD-10-CM | POA: Diagnosis not present

## 2017-10-03 DIAGNOSIS — J449 Chronic obstructive pulmonary disease, unspecified: Secondary | ICD-10-CM | POA: Diagnosis not present

## 2017-10-03 DIAGNOSIS — I110 Hypertensive heart disease with heart failure: Secondary | ICD-10-CM | POA: Diagnosis not present

## 2017-10-04 DIAGNOSIS — I110 Hypertensive heart disease with heart failure: Secondary | ICD-10-CM | POA: Diagnosis not present

## 2017-10-04 DIAGNOSIS — F329 Major depressive disorder, single episode, unspecified: Secondary | ICD-10-CM | POA: Diagnosis not present

## 2017-10-04 DIAGNOSIS — G4733 Obstructive sleep apnea (adult) (pediatric): Secondary | ICD-10-CM | POA: Diagnosis not present

## 2017-10-04 DIAGNOSIS — I5043 Acute on chronic combined systolic (congestive) and diastolic (congestive) heart failure: Secondary | ICD-10-CM | POA: Diagnosis not present

## 2017-10-04 DIAGNOSIS — J449 Chronic obstructive pulmonary disease, unspecified: Secondary | ICD-10-CM | POA: Diagnosis not present

## 2017-10-04 DIAGNOSIS — I739 Peripheral vascular disease, unspecified: Secondary | ICD-10-CM | POA: Diagnosis not present

## 2017-10-04 DIAGNOSIS — Z6841 Body Mass Index (BMI) 40.0 and over, adult: Secondary | ICD-10-CM | POA: Diagnosis not present

## 2017-10-04 DIAGNOSIS — I4892 Unspecified atrial flutter: Secondary | ICD-10-CM | POA: Diagnosis not present

## 2017-10-05 DIAGNOSIS — I110 Hypertensive heart disease with heart failure: Secondary | ICD-10-CM | POA: Diagnosis not present

## 2017-10-05 DIAGNOSIS — G4733 Obstructive sleep apnea (adult) (pediatric): Secondary | ICD-10-CM | POA: Diagnosis not present

## 2017-10-05 DIAGNOSIS — Z6841 Body Mass Index (BMI) 40.0 and over, adult: Secondary | ICD-10-CM | POA: Diagnosis not present

## 2017-10-05 DIAGNOSIS — J449 Chronic obstructive pulmonary disease, unspecified: Secondary | ICD-10-CM | POA: Diagnosis not present

## 2017-10-05 DIAGNOSIS — F329 Major depressive disorder, single episode, unspecified: Secondary | ICD-10-CM | POA: Diagnosis not present

## 2017-10-05 DIAGNOSIS — I739 Peripheral vascular disease, unspecified: Secondary | ICD-10-CM | POA: Diagnosis not present

## 2017-10-05 DIAGNOSIS — I5043 Acute on chronic combined systolic (congestive) and diastolic (congestive) heart failure: Secondary | ICD-10-CM | POA: Diagnosis not present

## 2017-10-05 DIAGNOSIS — I4892 Unspecified atrial flutter: Secondary | ICD-10-CM | POA: Diagnosis not present

## 2017-10-06 DIAGNOSIS — G4733 Obstructive sleep apnea (adult) (pediatric): Secondary | ICD-10-CM | POA: Diagnosis not present

## 2017-10-06 DIAGNOSIS — F329 Major depressive disorder, single episode, unspecified: Secondary | ICD-10-CM | POA: Diagnosis not present

## 2017-10-06 DIAGNOSIS — I5043 Acute on chronic combined systolic (congestive) and diastolic (congestive) heart failure: Secondary | ICD-10-CM | POA: Diagnosis not present

## 2017-10-06 DIAGNOSIS — Z6841 Body Mass Index (BMI) 40.0 and over, adult: Secondary | ICD-10-CM | POA: Diagnosis not present

## 2017-10-06 DIAGNOSIS — J449 Chronic obstructive pulmonary disease, unspecified: Secondary | ICD-10-CM | POA: Diagnosis not present

## 2017-10-06 DIAGNOSIS — I4892 Unspecified atrial flutter: Secondary | ICD-10-CM | POA: Diagnosis not present

## 2017-10-06 DIAGNOSIS — I110 Hypertensive heart disease with heart failure: Secondary | ICD-10-CM | POA: Diagnosis not present

## 2017-10-06 DIAGNOSIS — I739 Peripheral vascular disease, unspecified: Secondary | ICD-10-CM | POA: Diagnosis not present

## 2017-10-07 DIAGNOSIS — M1009 Idiopathic gout, multiple sites: Secondary | ICD-10-CM | POA: Diagnosis not present

## 2017-10-07 DIAGNOSIS — I5041 Acute combined systolic (congestive) and diastolic (congestive) heart failure: Secondary | ICD-10-CM | POA: Diagnosis not present

## 2017-10-07 DIAGNOSIS — J441 Chronic obstructive pulmonary disease with (acute) exacerbation: Secondary | ICD-10-CM | POA: Diagnosis not present

## 2017-10-07 DIAGNOSIS — Z6838 Body mass index (BMI) 38.0-38.9, adult: Secondary | ICD-10-CM | POA: Diagnosis not present

## 2017-10-07 DIAGNOSIS — I10 Essential (primary) hypertension: Secondary | ICD-10-CM | POA: Diagnosis not present

## 2017-10-12 DIAGNOSIS — Z6841 Body Mass Index (BMI) 40.0 and over, adult: Secondary | ICD-10-CM | POA: Diagnosis not present

## 2017-10-12 DIAGNOSIS — I739 Peripheral vascular disease, unspecified: Secondary | ICD-10-CM | POA: Diagnosis not present

## 2017-10-12 DIAGNOSIS — G4733 Obstructive sleep apnea (adult) (pediatric): Secondary | ICD-10-CM | POA: Diagnosis not present

## 2017-10-12 DIAGNOSIS — F329 Major depressive disorder, single episode, unspecified: Secondary | ICD-10-CM | POA: Diagnosis not present

## 2017-10-12 DIAGNOSIS — J449 Chronic obstructive pulmonary disease, unspecified: Secondary | ICD-10-CM | POA: Diagnosis not present

## 2017-10-12 DIAGNOSIS — I5043 Acute on chronic combined systolic (congestive) and diastolic (congestive) heart failure: Secondary | ICD-10-CM | POA: Diagnosis not present

## 2017-10-12 DIAGNOSIS — I110 Hypertensive heart disease with heart failure: Secondary | ICD-10-CM | POA: Diagnosis not present

## 2017-10-12 DIAGNOSIS — I4892 Unspecified atrial flutter: Secondary | ICD-10-CM | POA: Diagnosis not present

## 2017-10-19 DIAGNOSIS — I110 Hypertensive heart disease with heart failure: Secondary | ICD-10-CM | POA: Diagnosis not present

## 2017-10-19 DIAGNOSIS — I4892 Unspecified atrial flutter: Secondary | ICD-10-CM | POA: Diagnosis not present

## 2017-10-19 DIAGNOSIS — F329 Major depressive disorder, single episode, unspecified: Secondary | ICD-10-CM | POA: Diagnosis not present

## 2017-10-19 DIAGNOSIS — I739 Peripheral vascular disease, unspecified: Secondary | ICD-10-CM | POA: Diagnosis not present

## 2017-10-19 DIAGNOSIS — Z6841 Body Mass Index (BMI) 40.0 and over, adult: Secondary | ICD-10-CM | POA: Diagnosis not present

## 2017-10-19 DIAGNOSIS — J449 Chronic obstructive pulmonary disease, unspecified: Secondary | ICD-10-CM | POA: Diagnosis not present

## 2017-10-19 DIAGNOSIS — I5043 Acute on chronic combined systolic (congestive) and diastolic (congestive) heart failure: Secondary | ICD-10-CM | POA: Diagnosis not present

## 2017-10-19 DIAGNOSIS — G4733 Obstructive sleep apnea (adult) (pediatric): Secondary | ICD-10-CM | POA: Diagnosis not present

## 2017-10-28 DIAGNOSIS — I739 Peripheral vascular disease, unspecified: Secondary | ICD-10-CM | POA: Diagnosis not present

## 2017-10-28 DIAGNOSIS — I5043 Acute on chronic combined systolic (congestive) and diastolic (congestive) heart failure: Secondary | ICD-10-CM | POA: Diagnosis not present

## 2017-10-28 DIAGNOSIS — J449 Chronic obstructive pulmonary disease, unspecified: Secondary | ICD-10-CM | POA: Diagnosis not present

## 2017-10-28 DIAGNOSIS — I110 Hypertensive heart disease with heart failure: Secondary | ICD-10-CM | POA: Diagnosis not present

## 2017-10-28 DIAGNOSIS — Z6841 Body Mass Index (BMI) 40.0 and over, adult: Secondary | ICD-10-CM | POA: Diagnosis not present

## 2017-10-28 DIAGNOSIS — I4892 Unspecified atrial flutter: Secondary | ICD-10-CM | POA: Diagnosis not present

## 2017-10-28 DIAGNOSIS — G4733 Obstructive sleep apnea (adult) (pediatric): Secondary | ICD-10-CM | POA: Diagnosis not present

## 2017-10-28 DIAGNOSIS — F329 Major depressive disorder, single episode, unspecified: Secondary | ICD-10-CM | POA: Diagnosis not present

## 2017-10-31 DIAGNOSIS — I739 Peripheral vascular disease, unspecified: Secondary | ICD-10-CM | POA: Diagnosis not present

## 2017-10-31 DIAGNOSIS — Z6841 Body Mass Index (BMI) 40.0 and over, adult: Secondary | ICD-10-CM | POA: Diagnosis not present

## 2017-10-31 DIAGNOSIS — I110 Hypertensive heart disease with heart failure: Secondary | ICD-10-CM | POA: Diagnosis not present

## 2017-10-31 DIAGNOSIS — J449 Chronic obstructive pulmonary disease, unspecified: Secondary | ICD-10-CM | POA: Diagnosis not present

## 2017-10-31 DIAGNOSIS — G4733 Obstructive sleep apnea (adult) (pediatric): Secondary | ICD-10-CM | POA: Diagnosis not present

## 2017-10-31 DIAGNOSIS — I4892 Unspecified atrial flutter: Secondary | ICD-10-CM | POA: Diagnosis not present

## 2017-10-31 DIAGNOSIS — I5043 Acute on chronic combined systolic (congestive) and diastolic (congestive) heart failure: Secondary | ICD-10-CM | POA: Diagnosis not present

## 2017-10-31 DIAGNOSIS — F329 Major depressive disorder, single episode, unspecified: Secondary | ICD-10-CM | POA: Diagnosis not present

## 2017-11-10 DIAGNOSIS — J449 Chronic obstructive pulmonary disease, unspecified: Secondary | ICD-10-CM | POA: Diagnosis not present

## 2017-11-10 DIAGNOSIS — G4733 Obstructive sleep apnea (adult) (pediatric): Secondary | ICD-10-CM | POA: Diagnosis not present

## 2017-11-10 DIAGNOSIS — I739 Peripheral vascular disease, unspecified: Secondary | ICD-10-CM | POA: Diagnosis not present

## 2017-11-10 DIAGNOSIS — I4892 Unspecified atrial flutter: Secondary | ICD-10-CM | POA: Diagnosis not present

## 2017-11-10 DIAGNOSIS — I5043 Acute on chronic combined systolic (congestive) and diastolic (congestive) heart failure: Secondary | ICD-10-CM | POA: Diagnosis not present

## 2017-11-10 DIAGNOSIS — Z6841 Body Mass Index (BMI) 40.0 and over, adult: Secondary | ICD-10-CM | POA: Diagnosis not present

## 2017-11-10 DIAGNOSIS — F329 Major depressive disorder, single episode, unspecified: Secondary | ICD-10-CM | POA: Diagnosis not present

## 2017-11-10 DIAGNOSIS — I110 Hypertensive heart disease with heart failure: Secondary | ICD-10-CM | POA: Diagnosis not present

## 2017-11-15 DIAGNOSIS — I4892 Unspecified atrial flutter: Secondary | ICD-10-CM | POA: Diagnosis not present

## 2017-11-15 DIAGNOSIS — G4733 Obstructive sleep apnea (adult) (pediatric): Secondary | ICD-10-CM | POA: Diagnosis not present

## 2017-11-15 DIAGNOSIS — F329 Major depressive disorder, single episode, unspecified: Secondary | ICD-10-CM | POA: Diagnosis not present

## 2017-11-15 DIAGNOSIS — I739 Peripheral vascular disease, unspecified: Secondary | ICD-10-CM | POA: Diagnosis not present

## 2017-11-15 DIAGNOSIS — I5043 Acute on chronic combined systolic (congestive) and diastolic (congestive) heart failure: Secondary | ICD-10-CM | POA: Diagnosis not present

## 2017-11-15 DIAGNOSIS — J449 Chronic obstructive pulmonary disease, unspecified: Secondary | ICD-10-CM | POA: Diagnosis not present

## 2017-11-15 DIAGNOSIS — Z6841 Body Mass Index (BMI) 40.0 and over, adult: Secondary | ICD-10-CM | POA: Diagnosis not present

## 2017-11-15 DIAGNOSIS — I110 Hypertensive heart disease with heart failure: Secondary | ICD-10-CM | POA: Diagnosis not present

## 2017-12-02 ENCOUNTER — Ambulatory Visit: Payer: Medicare HMO | Admitting: Cardiovascular Disease

## 2017-12-06 ENCOUNTER — Ambulatory Visit (INDEPENDENT_AMBULATORY_CARE_PROVIDER_SITE_OTHER): Payer: Medicare HMO | Admitting: Cardiovascular Disease

## 2017-12-06 ENCOUNTER — Encounter: Payer: Self-pay | Admitting: Cardiovascular Disease

## 2017-12-06 VITALS — BP 152/92 | HR 83 | Ht 72.0 in | Wt 294.0 lb

## 2017-12-06 DIAGNOSIS — R600 Localized edema: Secondary | ICD-10-CM | POA: Diagnosis not present

## 2017-12-06 DIAGNOSIS — I10 Essential (primary) hypertension: Secondary | ICD-10-CM

## 2017-12-06 DIAGNOSIS — I4892 Unspecified atrial flutter: Secondary | ICD-10-CM | POA: Diagnosis not present

## 2017-12-06 DIAGNOSIS — I5032 Chronic diastolic (congestive) heart failure: Secondary | ICD-10-CM | POA: Diagnosis not present

## 2017-12-06 DIAGNOSIS — Z720 Tobacco use: Secondary | ICD-10-CM | POA: Diagnosis not present

## 2017-12-06 DIAGNOSIS — I739 Peripheral vascular disease, unspecified: Secondary | ICD-10-CM

## 2017-12-06 DIAGNOSIS — Z79899 Other long term (current) drug therapy: Secondary | ICD-10-CM | POA: Diagnosis not present

## 2017-12-06 MED ORDER — AMLODIPINE BESYLATE 5 MG PO TABS: 5.0000 mg | ORAL_TABLET | Freq: Every day | ORAL | 6 refills | Status: AC

## 2017-12-06 NOTE — Progress Notes (Signed)
SUBJECTIVE: Patient presents for follow-up of chronic diastolic heart failure and atrial flutter.  He has been doing fairly well overall.  He is trying to limit sodium intake.  His blood pressure is elevated today, 152/92.  He noted that it was elevated with systolic readings greater than 140 at his PCPs office as well.  His PCP reduce torsemide from 60 to 40 mg daily.      Review of Systems: As per "subjective", otherwise negative.  No Known Allergies  Current Outpatient Medications  Medication Sig Dispense Refill  . albuterol (ACCUNEB) 0.63 MG/3ML nebulizer solution Take 1 ampule by nebulization every 6 (six) hours as needed for wheezing.    Marland Kitchen. albuterol (PROVENTIL HFA;VENTOLIN HFA) 108 (90 Base) MCG/ACT inhaler Inhale into the lungs every 6 (six) hours as needed for wheezing or shortness of breath.    . allopurinol (ZYLOPRIM) 300 MG tablet Take 300 mg by mouth daily as needed (for joint pain).     Marland Kitchen. apixaban (ELIQUIS) 5 MG TABS tablet Take 1 tablet (5 mg total) by mouth 2 (two) times daily. 60 tablet 1  . citalopram (CELEXA) 20 MG tablet TAKE 1 TABLET BY MOUTH EVERY MORNING 15 tablet 0  . gabapentin (NEURONTIN) 300 MG capsule Take 300 mg by mouth 3 (three) times daily.    Marland Kitchen. LORazepam (ATIVAN) 1 MG tablet Take 1 mg by mouth every 8 (eight) hours.    . metoprolol tartrate (LOPRESSOR) 50 MG tablet Take 0.5 tablets (25 mg total) by mouth 2 (two) times daily.    Marland Kitchen. nystatin (MYCOSTATIN/NYSTOP) powder Apply topically daily as needed (for rash).     . potassium chloride (K-DUR) 10 MEQ tablet Take 10 mEq by mouth daily.    . ranitidine (ZANTAC) 300 MG capsule Take 300 mg by mouth every evening.    . torsemide (DEMADEX) 20 MG tablet Take 40 mg by mouth daily.    Marland Kitchen. umeclidinium bromide (INCRUSE ELLIPTA) 62.5 MCG/INH AEPB Inhale 1 puff into the lungs daily.     No current facility-administered medications for this visit.     Past Medical History:  Diagnosis Date  . Anxiety and  depression   . AVNRT (AV nodal re-entry tachycardia) (HCC)    Status post ablation 2010  . COPD (chronic obstructive pulmonary disease) (HCC)   . GERD (gastroesophageal reflux disease)   . History of nephrolithiasis   . Mixed hyperlipidemia   . Obstructive sleep apnea    does not wear CPAP  . Panic disorder   . Peripheral arterial disease (HCC)    Status post PTCA/stent to right common iliac artery    Past Surgical History:  Procedure Laterality Date  . BACK SURGERY    . CARDIAC ELECTROPHYSIOLOGY MAPPING AND ABLATION    . CORONARY ANGIOPLASTY WITH STENT PLACEMENT    . Kidney stone removal      Social History   Socioeconomic History  . Marital status: Married    Spouse name: Not on file  . Number of children: Not on file  . Years of education: Not on file  . Highest education level: Not on file  Social Needs  . Financial resource strain: Not on file  . Food insecurity - worry: Not on file  . Food insecurity - inability: Not on file  . Transportation needs - medical: Not on file  . Transportation needs - non-medical: Not on file  Occupational History  . Occupation: retired  Tobacco Use  . Smoking status: Current Every  Day Smoker    Packs/day: 1.00    Years: 43.00    Pack years: 43.00    Types: Cigarettes  . Smokeless tobacco: Never Used  Substance and Sexual Activity  . Alcohol use: No  . Drug use: No  . Sexual activity: Not on file  Other Topics Concern  . Not on file  Social History Narrative  . Not on file     Vitals:   12/06/17 1055  BP: (!) 152/92  Pulse: 83  SpO2: 98%  Weight: 294 lb (133.4 kg)  Height: 6' (1.829 m)    Wt Readings from Last 3 Encounters:  12/06/17 294 lb (133.4 kg)  09/21/17 291 lb 3.2 oz (132.1 kg)  09/17/17 281 lb (127.5 kg)     PHYSICAL EXAM General: NAD HEENT: Normal. Neck: No JVD, no thyromegaly. Lungs: Clear to auscultation bilaterally with normal respiratory effort. CV: Regular rate and rhythm, normal S1/S2, no  S3/S4, no murmur. Trace bilateral pretibial edema.  Abdomen: Soft, nontender, no distention.  Neurologic: Alert and oriented.  Psych: Normal affect. Skin: Normal. Musculoskeletal: No gross deformities.    ECG: Most recent ECG reviewed.   Labs: Lab Results  Component Value Date/Time   K 5.4 (H) 09/17/2017 07:00 AM   BUN 63 (H) 09/17/2017 07:00 AM   CREATININE 1.78 (H) 09/17/2017 07:00 AM   TSH 1.345 09/07/2017 03:27 PM   HGB 13.9 09/15/2017 04:55 AM     Lipids: No results found for: LDLCALC, LDLDIRECT, CHOL, TRIG, HDL     ASSESSMENT AND PLAN:  1. Chronic diastolic heart failure: Euvolemic. Blood pressure is elevated.  I will initiate amlodipine 5 mg daily.  Currently on torsemide 40 mg daily.  2. Atrial flutter: Heart rate is controlled on metoprolol tartrate 12.5 twice daily. Anticoagulated with Eliquis. No changes to therapy.  3. Peripheral vascular disease: I may initiate statin therapy at his next visit. He is on Eliquis so I will avoid aspirin.  4. Hypertension: Elevated today but normal at last visit.  I will initiate amlodipine 5 mg daily.   5. Tobacco abuse: Cessation counseling previously provided. He has cut back to less than a pack of cigarettes daily.     Disposition: Follow up 6 months   Prentice Docker, M.D., F.A.C.C.

## 2017-12-06 NOTE — Patient Instructions (Signed)
Medication Instructions:   Begin Amlodipine 5mg daily.  Continue all other medications.    Labwork: none  Testing/Procedures: none  Follow-Up: Your physician wants you to follow up in: 6 months.  You will receive a reminder letter in the mail one-two months in advance.  If you don't receive a letter, please call our office to schedule the follow up appointment   Any Other Special Instructions Will Be Listed Below (If Applicable).  If you need a refill on your cardiac medications before your next appointment, please call your pharmacy.  

## 2017-12-20 ENCOUNTER — Encounter: Payer: Self-pay | Admitting: *Deleted

## 2017-12-26 ENCOUNTER — Telehealth: Payer: Self-pay | Admitting: Cardiovascular Disease

## 2017-12-26 NOTE — Telephone Encounter (Signed)
Patient called stating that he is returning a call.

## 2017-12-26 NOTE — Telephone Encounter (Signed)
Informed patient that Dr. Purvis SheffieldKoneswaran cleared him for back surgery.  Will hold his Eliquis for 72 hours then resume when surgeon deems feasible.  Clearance note faxed to Dr. Channing Muttersoy on 12/23/2017.

## 2017-12-29 DIAGNOSIS — M4712 Other spondylosis with myelopathy, cervical region: Secondary | ICD-10-CM | POA: Diagnosis not present

## 2018-01-10 DIAGNOSIS — Z0000 Encounter for general adult medical examination without abnormal findings: Secondary | ICD-10-CM | POA: Diagnosis not present

## 2018-01-10 DIAGNOSIS — J441 Chronic obstructive pulmonary disease with (acute) exacerbation: Secondary | ICD-10-CM | POA: Diagnosis not present

## 2018-01-10 DIAGNOSIS — Z1389 Encounter for screening for other disorder: Secondary | ICD-10-CM | POA: Diagnosis not present

## 2018-01-10 DIAGNOSIS — M1009 Idiopathic gout, multiple sites: Secondary | ICD-10-CM | POA: Diagnosis not present

## 2018-01-10 DIAGNOSIS — Z6838 Body mass index (BMI) 38.0-38.9, adult: Secondary | ICD-10-CM | POA: Diagnosis not present

## 2018-01-10 DIAGNOSIS — I5041 Acute combined systolic (congestive) and diastolic (congestive) heart failure: Secondary | ICD-10-CM | POA: Diagnosis not present

## 2018-01-10 DIAGNOSIS — I10 Essential (primary) hypertension: Secondary | ICD-10-CM | POA: Diagnosis not present

## 2018-02-08 DIAGNOSIS — I10 Essential (primary) hypertension: Secondary | ICD-10-CM | POA: Diagnosis not present

## 2018-02-08 DIAGNOSIS — J441 Chronic obstructive pulmonary disease with (acute) exacerbation: Secondary | ICD-10-CM | POA: Diagnosis not present

## 2018-02-08 DIAGNOSIS — Z6841 Body Mass Index (BMI) 40.0 and over, adult: Secondary | ICD-10-CM | POA: Diagnosis not present

## 2018-02-08 DIAGNOSIS — I480 Paroxysmal atrial fibrillation: Secondary | ICD-10-CM | POA: Diagnosis not present

## 2018-02-08 DIAGNOSIS — M5126 Other intervertebral disc displacement, lumbar region: Secondary | ICD-10-CM | POA: Diagnosis not present

## 2018-02-08 DIAGNOSIS — I5031 Acute diastolic (congestive) heart failure: Secondary | ICD-10-CM | POA: Diagnosis not present

## 2018-02-08 DIAGNOSIS — J449 Chronic obstructive pulmonary disease, unspecified: Secondary | ICD-10-CM | POA: Diagnosis not present

## 2018-02-08 DIAGNOSIS — I110 Hypertensive heart disease with heart failure: Secondary | ICD-10-CM | POA: Diagnosis not present

## 2018-02-08 DIAGNOSIS — I5041 Acute combined systolic (congestive) and diastolic (congestive) heart failure: Secondary | ICD-10-CM | POA: Diagnosis not present

## 2018-02-08 DIAGNOSIS — F418 Other specified anxiety disorders: Secondary | ICD-10-CM | POA: Diagnosis not present

## 2018-02-08 DIAGNOSIS — G4730 Sleep apnea, unspecified: Secondary | ICD-10-CM | POA: Diagnosis not present

## 2018-02-08 DIAGNOSIS — E785 Hyperlipidemia, unspecified: Secondary | ICD-10-CM | POA: Diagnosis not present

## 2018-02-08 DIAGNOSIS — I509 Heart failure, unspecified: Secondary | ICD-10-CM | POA: Diagnosis not present

## 2018-02-08 DIAGNOSIS — I4892 Unspecified atrial flutter: Secondary | ICD-10-CM | POA: Diagnosis not present

## 2018-02-09 DIAGNOSIS — I110 Hypertensive heart disease with heart failure: Secondary | ICD-10-CM | POA: Diagnosis not present

## 2018-02-10 IMAGING — MR MR CERVICAL SPINE WO/W CM
5 of 8 series · 26 of 48 positions shown · IV contrast (Multihance 20 ML)
Comparison: None available.

CLINICAL DATA: Initial evaluation for cervical myelopathy.

EXAM:
MRI CERVICAL SPINE WITHOUT AND WITH CONTRAST
TECHNIQUE: Multiplanar and multiecho pulse sequences of the cervical spine, to
include the craniocervical junction and cervicothoracic junction,
were obtained without and with intravenous contrast.
CONTRAST:  20mL MULTIHANCE GADOBENATE DIMEGLUMINE 529 MG/ML IV SOLN

[Series 6: T1 · sagittal · 3.3mm · 0.69mm/px · 4 of 13 slices shown (1 of 3)]
[im 1/13]
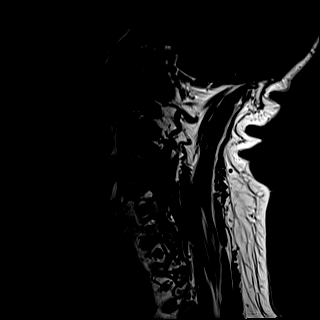
[im 5/13]
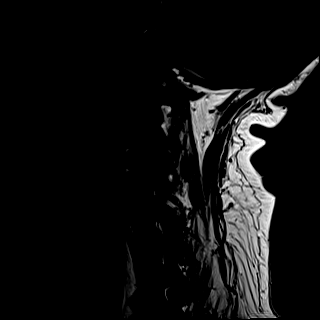
[im 9/13]
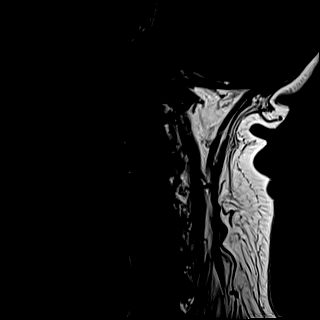
[im 13/13]
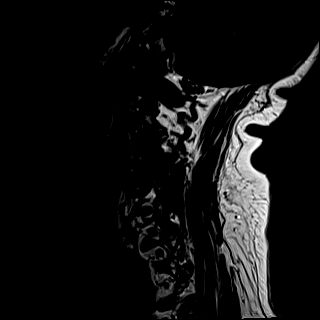

[Series 8: T2 · oblique · 3.0mm · 0.53mm/px · 8 of 30 slices shown (1 of 2)]
[im 1/30]
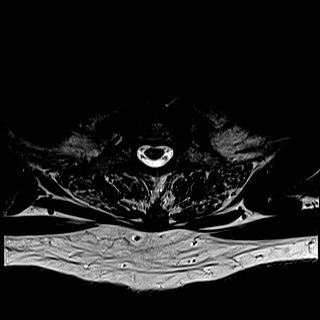
[im 5/30]
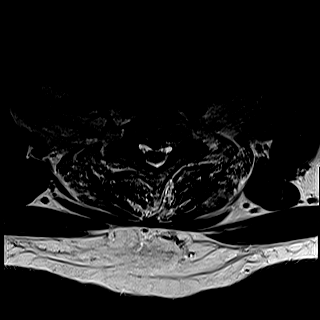
[im 9/30]
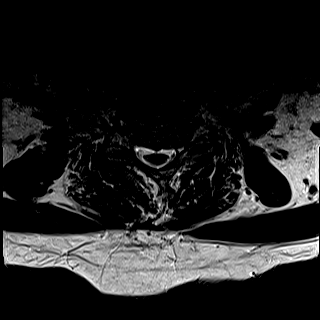
[im 13/30]
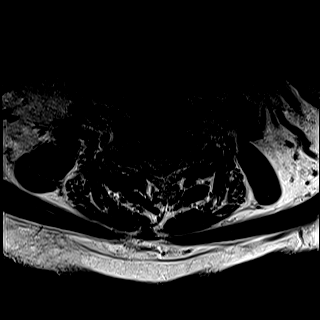
[im 17/30]
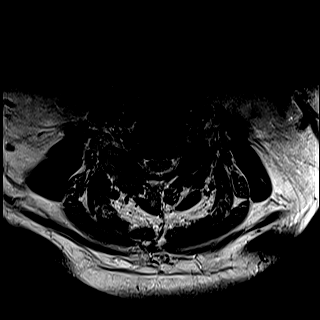
[im 21/30]
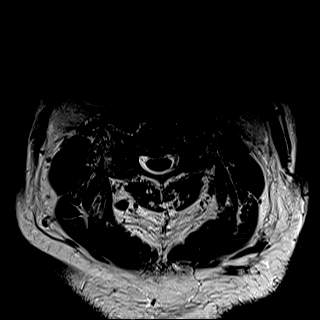
[im 25/30]
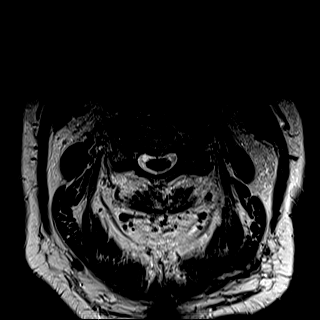
[im 30/30]
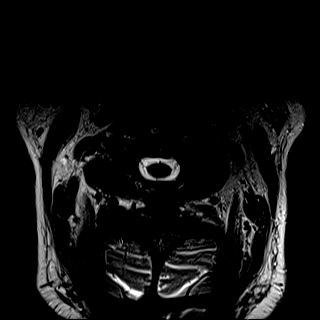

[Series 10: T1 · oblique · non-contrast · 3.0mm · 0.33mm/px · 8 of 30 slices shown (2 of 3)]
[im 1/30]
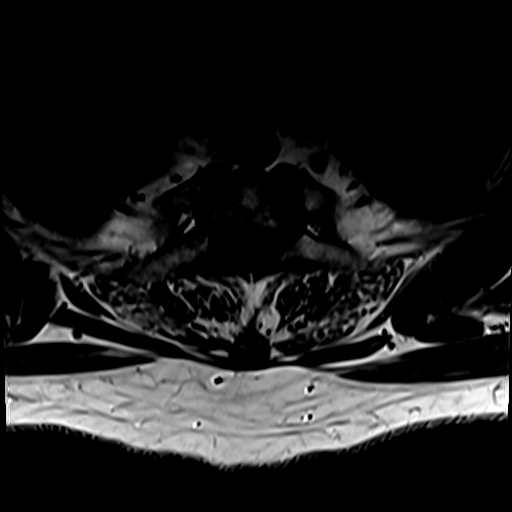
[im 5/30]
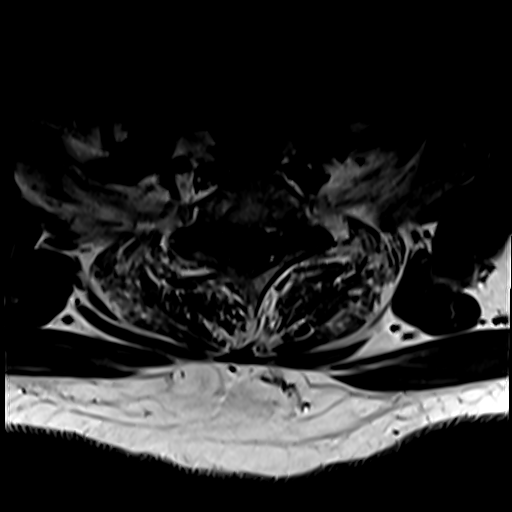
[im 9/30]
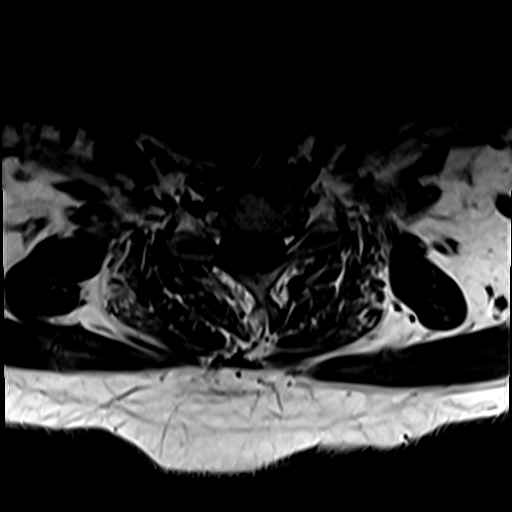
[im 13/30]
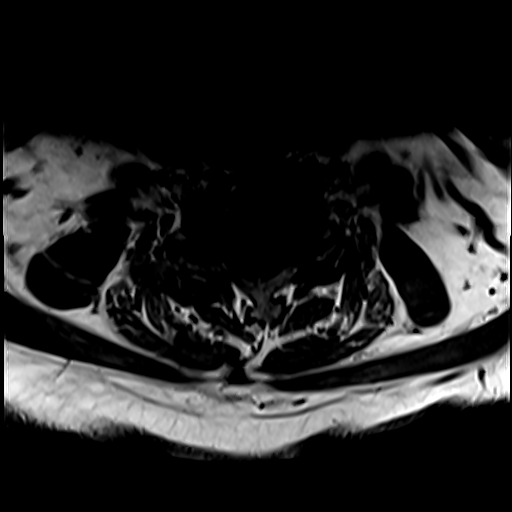
[im 17/30]
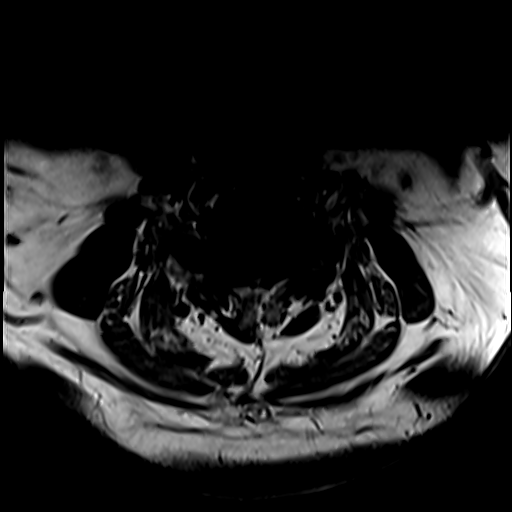
[im 21/30]
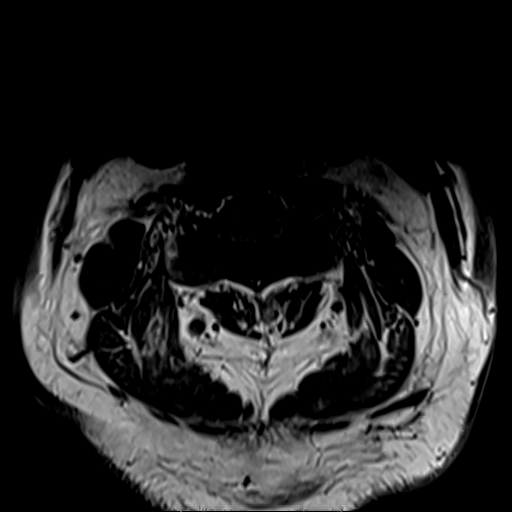
[im 25/30]
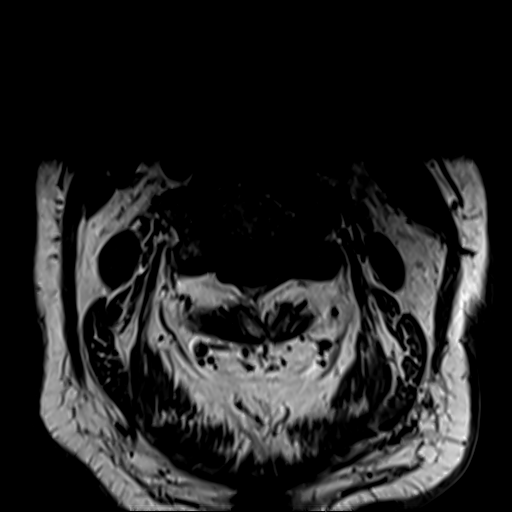
[im 30/30]
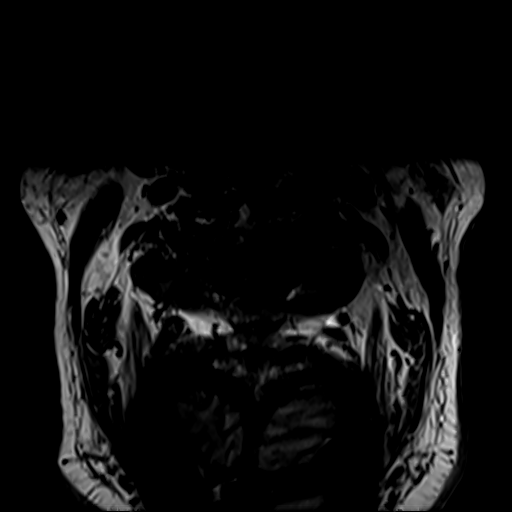

[Series 11: T2 · sagittal · 3.3mm · 0.57mm/px · 4 of 13 slices shown (2 of 2)]
[im 1/13]
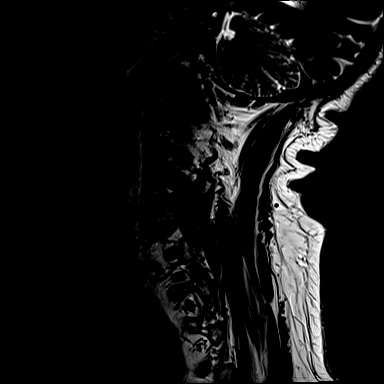
[im 5/13]
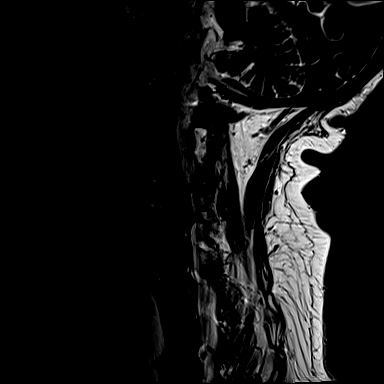
[im 9/13]
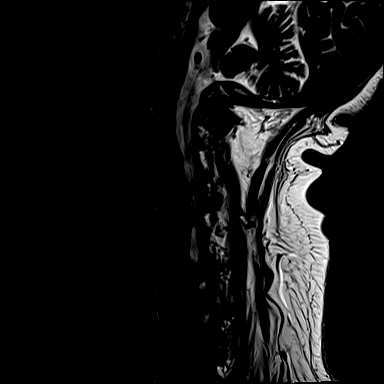
[im 13/13]
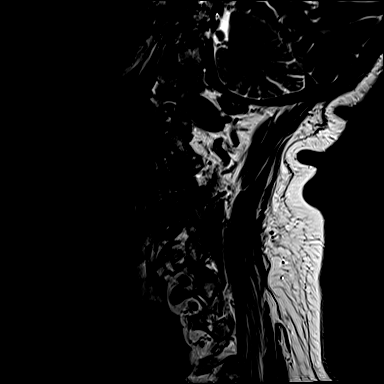

[Series 13: T1 · oblique · 3.0mm · 0.33mm/px · 2 of 30 slices shown (3 of 3)]
[im 1/30]
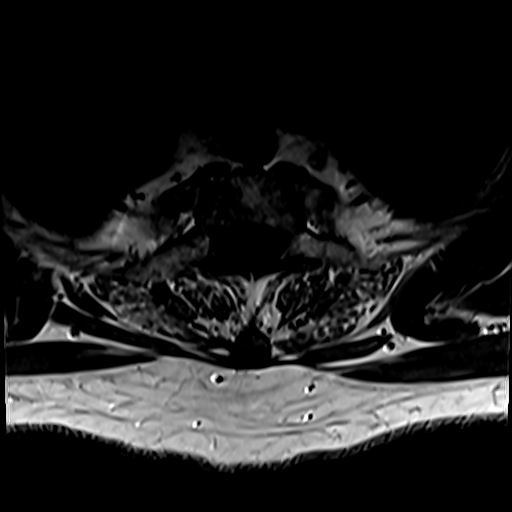
[im 5/30]
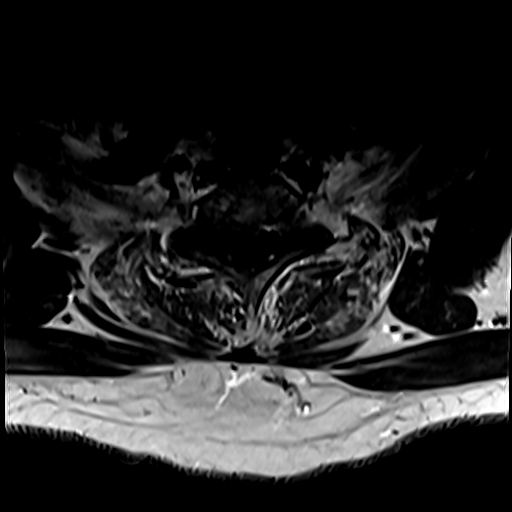

[26 of 48 positions shown; findings below may reference images not displayed]

FINDINGS: Alignment: Study degraded by motion artifact.

Straightening with slight reversal of the normal cervical lordosis.
Trace anterolisthesis of C3 on C4.

Vertebrae: Patient is status post fusion at C6-7. There appears to
be complete osseous fusion at this level. Vertebral body heights
maintained. No evidence for acute or chronic fracture. Signal
intensity within the vertebral body bone marrow within normal
limits. No discrete or worrisome osseous lesions. No abnormal
enhancement.

Cord: Subtle 4 mm focus of T2 hyperintensity within the right dorsal
aspect of the cervical spinal cord at C6, suspicious for chronic
myelomalacia (series 8, image 20). Signal intensity within the
cervical spinal cord otherwise within normal limits.

Posterior Fossa, vertebral arteries, paraspinal tissues: Visualized
brain and posterior fossa within normal limits. Craniocervical
junction normal. Paraspinous soft tissues within normal limits.
Normal intravascular flow voids within the vertebral artery's noted.

Disc levels:

C2-C3: No significant disc bulge. Left-sided uncovertebral
hypertrophy. Prominent left-sided facet degeneration. Resultant
moderate left C3 foraminal stenosis. No significant canal or right
foraminal narrowing.

C3-C4: Diffuse disc bulge with bilateral uncovertebral spurring,
right greater than left. Prominent right-sided facet degeneration
with more mild left-sided facet arthropathy. Bulging disc flattens
and partially faces the ventral thecal sac results in mild spinal
stenosis. Severe bilateral C4 foraminal stenosis, right worse than
left.

C4-C5: Diffuse degenerative disc osteophyte with mild intervertebral
disc space narrowing. Broad posterior component flattens and
partially faces the ventral thecal sac. Slight caudad migration of
disc material noted (series 11, image 7). Superimposed ligamentum
flavum thickening. Moderate spinal stenosis. Superimposed left
greater than right facet degeneration. Severe bilateral C5 foraminal
stenosis, left worse than right.

C5-C6: Broad central disc protrusion flattens and effaces the
ventral thecal sac. Superimposed ligamentum flavum thickening.
Secondary cord flattening with probable small focus of myelomalacia
just below with the level C6 as above. Severe canal stenosis with
the thecal sac measuring 6 mm in AP diameter. Severe bilateral C6
foraminal narrowing related to disc disc bulge and uncovertebral
disease.

C6-C7: Status post fusion. No residual canal stenosis. Residual
left-sided uncovertebral spurring with resultant moderate left C7
foraminal stenosis.

C7-T1: Central disc protrusion indents the ventral thecal sac.
Bilateral facet hypertrophy. Mild spinal stenosis. Mild right C8
foraminal stenosis.

Visualized upper thoracic spine within normal limits.
IMPRESSION: 1. Status post solid osseous fusion at C6-7 without canal stenosis.
Residual left-sided uncovertebral disease results in moderate left
C7 foraminal stenosis.
2. Adjacent segment disease with at C5-6 with resultant severe canal
and bilateral foraminal stenosis.
3. Degenerative disc osteophyte at C4-5 with resultant moderate
canal and severe bilateral C5 foraminal stenosis as above.
4. Severe bilateral C4 foraminal stenosis related to right worse
than left uncovertebral and facet disease.
5. Left-sided uncovertebral and facet disease at C2-3 with resultant
moderate left C3 foraminal stenosis.

## 2018-02-22 DIAGNOSIS — I10 Essential (primary) hypertension: Secondary | ICD-10-CM | POA: Diagnosis not present

## 2018-02-22 DIAGNOSIS — I5041 Acute combined systolic (congestive) and diastolic (congestive) heart failure: Secondary | ICD-10-CM | POA: Diagnosis not present

## 2018-03-09 IMAGING — CR DG CHEST 1V PORT
1 series · 1 of 1 positions shown · non-contrast
Comparison: Chest x-ray of May 23, 2017

CLINICAL DATA: Fluid retention, shortness of breath, and irregular
heartbeat. Current smoker. History of COPD, cardiac ablation, morbid
obesity.

EXAM:
PORTABLE CHEST 1 VIEW

[portable]
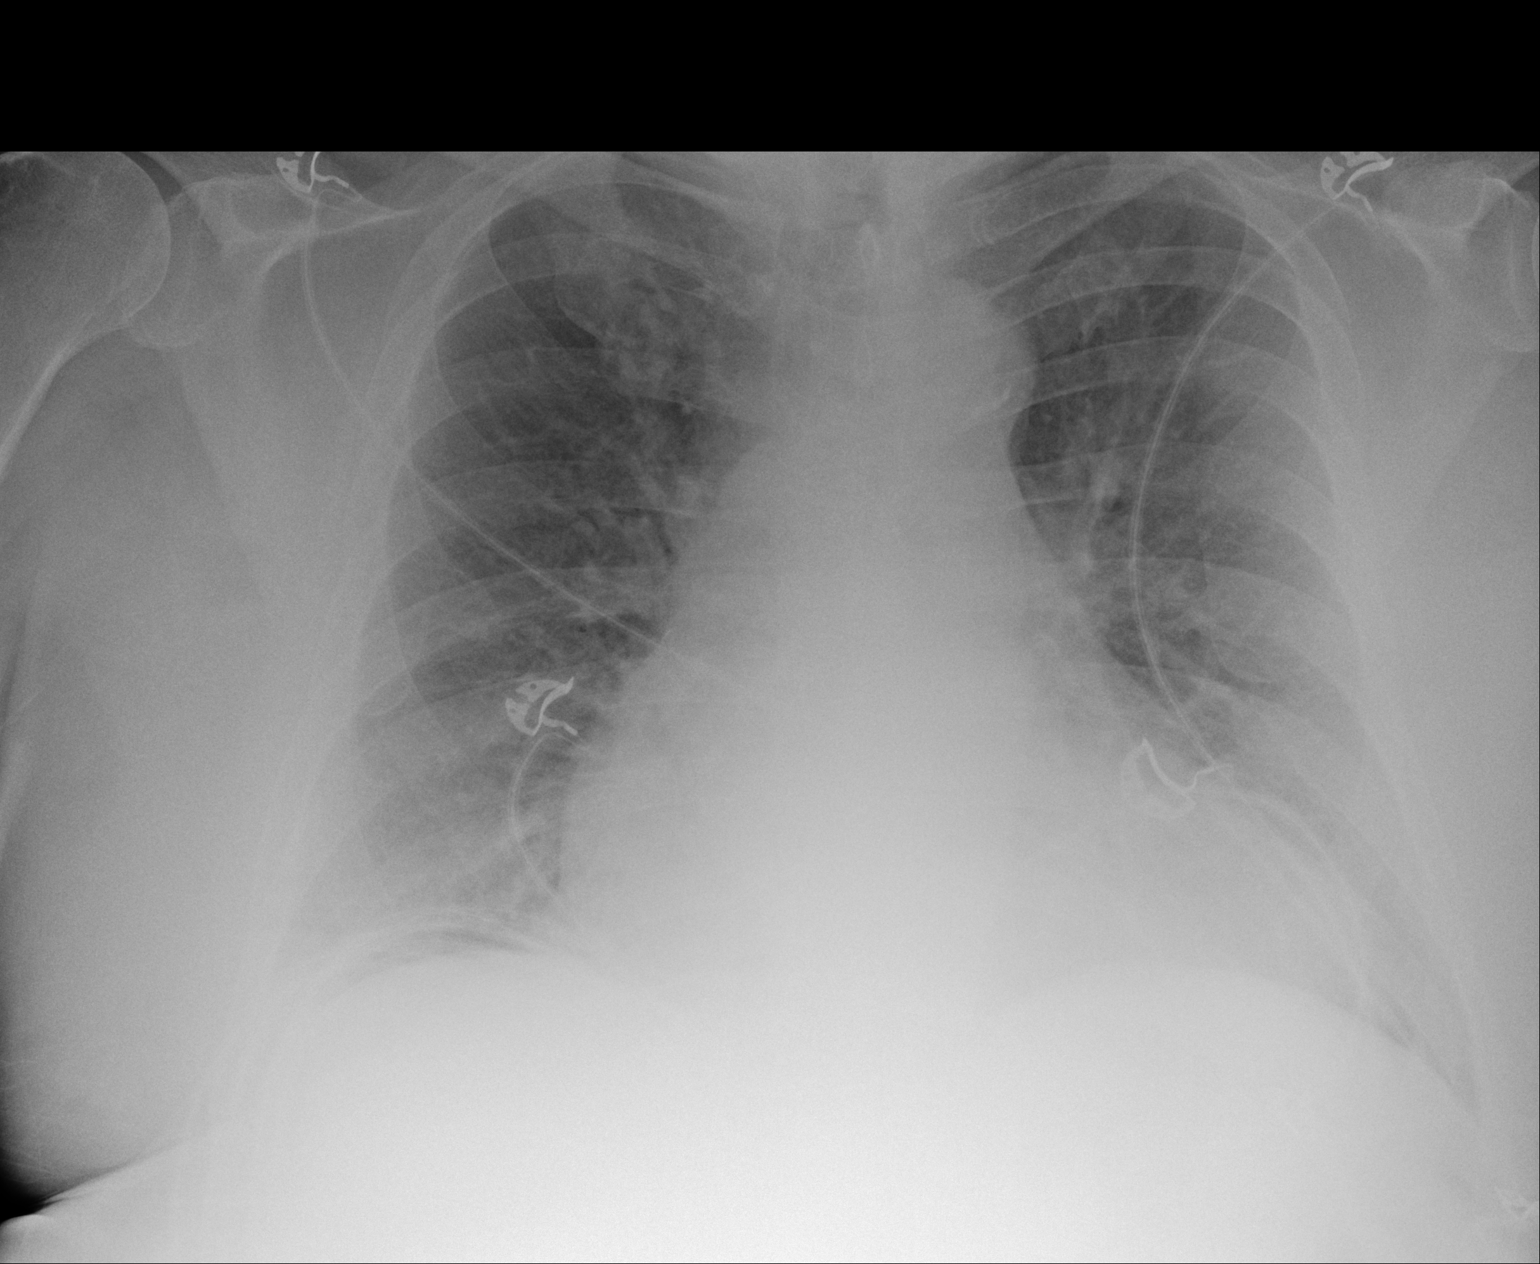

[1 of 1 positions shown; findings below may reference images not displayed]

FINDINGS: The cardiopericardial silhouette remains enlarged. The pulmonary
vascularity is engorged and there is mild cephalization. The
interstitial markings are both lungs are mildly increased. There is
no pleural effusion. There is calcification in the wall of the
aortic arch. The observed bony thorax is unremarkable.
IMPRESSION: CHF with mild interstitial edema.  No alveolar pneumonia.

Thoracic aortic atherosclerosis.

## 2018-03-23 DIAGNOSIS — G9761 Postprocedural hematoma of a nervous system organ or structure following a nervous system procedure: Secondary | ICD-10-CM | POA: Diagnosis not present

## 2018-03-23 DIAGNOSIS — Y95 Nosocomial condition: Secondary | ICD-10-CM | POA: Diagnosis not present

## 2018-03-23 DIAGNOSIS — J90 Pleural effusion, not elsewhere classified: Secondary | ICD-10-CM | POA: Diagnosis not present

## 2018-03-23 DIAGNOSIS — I10 Essential (primary) hypertension: Secondary | ICD-10-CM | POA: Diagnosis not present

## 2018-03-23 DIAGNOSIS — I5032 Chronic diastolic (congestive) heart failure: Secondary | ICD-10-CM | POA: Diagnosis not present

## 2018-03-23 DIAGNOSIS — M4712 Other spondylosis with myelopathy, cervical region: Secondary | ICD-10-CM | POA: Diagnosis not present

## 2018-03-23 DIAGNOSIS — J9811 Atelectasis: Secondary | ICD-10-CM | POA: Diagnosis not present

## 2018-03-23 DIAGNOSIS — Z4682 Encounter for fitting and adjustment of non-vascular catheter: Secondary | ICD-10-CM | POA: Diagnosis not present

## 2018-03-23 DIAGNOSIS — F329 Major depressive disorder, single episode, unspecified: Secondary | ICD-10-CM | POA: Diagnosis not present

## 2018-03-23 DIAGNOSIS — M4716 Other spondylosis with myelopathy, lumbar region: Secondary | ICD-10-CM | POA: Diagnosis not present

## 2018-03-23 DIAGNOSIS — J189 Pneumonia, unspecified organism: Secondary | ICD-10-CM | POA: Diagnosis not present

## 2018-03-23 DIAGNOSIS — R410 Disorientation, unspecified: Secondary | ICD-10-CM | POA: Diagnosis not present

## 2018-03-23 DIAGNOSIS — Z4889 Encounter for other specified surgical aftercare: Secondary | ICD-10-CM | POA: Diagnosis not present

## 2018-03-23 DIAGNOSIS — E876 Hypokalemia: Secondary | ICD-10-CM | POA: Diagnosis not present

## 2018-03-23 DIAGNOSIS — M6281 Muscle weakness (generalized): Secondary | ICD-10-CM | POA: Diagnosis not present

## 2018-03-23 DIAGNOSIS — I517 Cardiomegaly: Secondary | ICD-10-CM | POA: Diagnosis not present

## 2018-03-23 DIAGNOSIS — M5002 Cervical disc disorder with myelopathy, mid-cervical region, unspecified level: Secondary | ICD-10-CM | POA: Diagnosis not present

## 2018-03-23 DIAGNOSIS — R221 Localized swelling, mass and lump, neck: Secondary | ICD-10-CM | POA: Diagnosis not present

## 2018-03-23 DIAGNOSIS — T8489XA Other specified complication of internal orthopedic prosthetic devices, implants and grafts, initial encounter: Secondary | ICD-10-CM | POA: Diagnosis not present

## 2018-03-23 DIAGNOSIS — Z930 Tracheostomy status: Secondary | ICD-10-CM | POA: Diagnosis not present

## 2018-03-23 DIAGNOSIS — J168 Pneumonia due to other specified infectious organisms: Secondary | ICD-10-CM | POA: Diagnosis not present

## 2018-03-23 DIAGNOSIS — Z01818 Encounter for other preprocedural examination: Secondary | ICD-10-CM | POA: Diagnosis not present

## 2018-03-23 DIAGNOSIS — J440 Chronic obstructive pulmonary disease with acute lower respiratory infection: Secondary | ICD-10-CM | POA: Diagnosis not present

## 2018-03-23 DIAGNOSIS — F05 Delirium due to known physiological condition: Secondary | ICD-10-CM | POA: Diagnosis not present

## 2018-03-23 DIAGNOSIS — I509 Heart failure, unspecified: Secondary | ICD-10-CM | POA: Diagnosis not present

## 2018-03-23 DIAGNOSIS — R1312 Dysphagia, oropharyngeal phase: Secondary | ICD-10-CM | POA: Diagnosis not present

## 2018-03-23 DIAGNOSIS — J95821 Acute postprocedural respiratory failure: Secondary | ICD-10-CM | POA: Diagnosis not present

## 2018-03-23 DIAGNOSIS — Z981 Arthrodesis status: Secondary | ICD-10-CM | POA: Diagnosis not present

## 2018-03-23 DIAGNOSIS — Z9689 Presence of other specified functional implants: Secondary | ICD-10-CM | POA: Diagnosis not present

## 2018-03-23 DIAGNOSIS — K219 Gastro-esophageal reflux disease without esophagitis: Secondary | ICD-10-CM | POA: Diagnosis not present

## 2018-03-23 DIAGNOSIS — N390 Urinary tract infection, site not specified: Secondary | ICD-10-CM | POA: Diagnosis not present

## 2018-03-23 DIAGNOSIS — R918 Other nonspecific abnormal finding of lung field: Secondary | ICD-10-CM | POA: Diagnosis not present

## 2018-03-23 DIAGNOSIS — M7981 Nontraumatic hematoma of soft tissue: Secondary | ICD-10-CM | POA: Diagnosis not present

## 2018-03-23 DIAGNOSIS — I4891 Unspecified atrial fibrillation: Secondary | ICD-10-CM | POA: Diagnosis not present

## 2018-03-23 DIAGNOSIS — L0390 Cellulitis, unspecified: Secondary | ICD-10-CM | POA: Diagnosis not present

## 2018-03-23 DIAGNOSIS — J9690 Respiratory failure, unspecified, unspecified whether with hypoxia or hypercapnia: Secondary | ICD-10-CM | POA: Diagnosis not present

## 2018-03-23 DIAGNOSIS — M47812 Spondylosis without myelopathy or radiculopathy, cervical region: Secondary | ICD-10-CM | POA: Diagnosis not present

## 2018-03-23 DIAGNOSIS — J441 Chronic obstructive pulmonary disease with (acute) exacerbation: Secondary | ICD-10-CM | POA: Diagnosis not present

## 2018-03-23 DIAGNOSIS — M542 Cervicalgia: Secondary | ICD-10-CM | POA: Diagnosis not present

## 2018-03-23 DIAGNOSIS — J9600 Acute respiratory failure, unspecified whether with hypoxia or hypercapnia: Secondary | ICD-10-CM | POA: Diagnosis not present

## 2018-03-23 DIAGNOSIS — L03818 Cellulitis of other sites: Secondary | ICD-10-CM | POA: Diagnosis not present

## 2018-03-23 DIAGNOSIS — M96841 Postprocedural hematoma of a musculoskeletal structure following other procedure: Secondary | ICD-10-CM | POA: Diagnosis not present

## 2018-04-28 DIAGNOSIS — J449 Chronic obstructive pulmonary disease, unspecified: Secondary | ICD-10-CM | POA: Diagnosis not present

## 2018-04-28 DIAGNOSIS — R410 Disorientation, unspecified: Secondary | ICD-10-CM | POA: Diagnosis not present

## 2018-04-28 DIAGNOSIS — M96841 Postprocedural hematoma of a musculoskeletal structure following other procedure: Secondary | ICD-10-CM | POA: Diagnosis not present

## 2018-04-28 DIAGNOSIS — J441 Chronic obstructive pulmonary disease with (acute) exacerbation: Secondary | ICD-10-CM | POA: Diagnosis not present

## 2018-04-28 DIAGNOSIS — K219 Gastro-esophageal reflux disease without esophagitis: Secondary | ICD-10-CM | POA: Diagnosis not present

## 2018-04-28 DIAGNOSIS — I10 Essential (primary) hypertension: Secondary | ICD-10-CM | POA: Diagnosis not present

## 2018-04-28 DIAGNOSIS — M4322 Fusion of spine, cervical region: Secondary | ICD-10-CM | POA: Diagnosis not present

## 2018-04-28 DIAGNOSIS — I4891 Unspecified atrial fibrillation: Secondary | ICD-10-CM | POA: Diagnosis not present

## 2018-04-28 DIAGNOSIS — J440 Chronic obstructive pulmonary disease with acute lower respiratory infection: Secondary | ICD-10-CM | POA: Diagnosis not present

## 2018-04-28 DIAGNOSIS — F05 Delirium due to known physiological condition: Secondary | ICD-10-CM | POA: Diagnosis not present

## 2018-04-28 DIAGNOSIS — M4716 Other spondylosis with myelopathy, lumbar region: Secondary | ICD-10-CM | POA: Diagnosis not present

## 2018-04-28 DIAGNOSIS — N390 Urinary tract infection, site not specified: Secondary | ICD-10-CM | POA: Diagnosis not present

## 2018-04-28 DIAGNOSIS — M4712 Other spondylosis with myelopathy, cervical region: Secondary | ICD-10-CM | POA: Diagnosis not present

## 2018-04-28 DIAGNOSIS — J95821 Acute postprocedural respiratory failure: Secondary | ICD-10-CM | POA: Diagnosis not present

## 2018-04-28 DIAGNOSIS — M6281 Muscle weakness (generalized): Secondary | ICD-10-CM | POA: Diagnosis not present

## 2018-04-28 DIAGNOSIS — Z4889 Encounter for other specified surgical aftercare: Secondary | ICD-10-CM | POA: Diagnosis not present

## 2018-04-28 DIAGNOSIS — I5032 Chronic diastolic (congestive) heart failure: Secondary | ICD-10-CM | POA: Diagnosis not present

## 2018-04-28 DIAGNOSIS — Y95 Nosocomial condition: Secondary | ICD-10-CM | POA: Diagnosis not present

## 2018-04-28 DIAGNOSIS — J189 Pneumonia, unspecified organism: Secondary | ICD-10-CM | POA: Diagnosis not present

## 2018-04-28 DIAGNOSIS — E668 Other obesity: Secondary | ICD-10-CM | POA: Diagnosis not present

## 2018-04-28 DIAGNOSIS — R1312 Dysphagia, oropharyngeal phase: Secondary | ICD-10-CM | POA: Diagnosis not present

## 2018-04-28 DIAGNOSIS — J168 Pneumonia due to other specified infectious organisms: Secondary | ICD-10-CM | POA: Diagnosis not present

## 2018-04-28 DIAGNOSIS — F329 Major depressive disorder, single episode, unspecified: Secondary | ICD-10-CM | POA: Diagnosis not present

## 2018-04-29 DIAGNOSIS — I10 Essential (primary) hypertension: Secondary | ICD-10-CM | POA: Diagnosis not present

## 2018-04-29 DIAGNOSIS — E668 Other obesity: Secondary | ICD-10-CM | POA: Diagnosis not present

## 2018-04-29 DIAGNOSIS — M4322 Fusion of spine, cervical region: Secondary | ICD-10-CM | POA: Diagnosis not present

## 2018-04-29 DIAGNOSIS — J168 Pneumonia due to other specified infectious organisms: Secondary | ICD-10-CM | POA: Diagnosis not present

## 2018-04-29 DIAGNOSIS — J441 Chronic obstructive pulmonary disease with (acute) exacerbation: Secondary | ICD-10-CM | POA: Diagnosis not present

## 2018-06-19 DIAGNOSIS — I110 Hypertensive heart disease with heart failure: Secondary | ICD-10-CM | POA: Diagnosis not present

## 2018-06-19 DIAGNOSIS — M4712 Other spondylosis with myelopathy, cervical region: Secondary | ICD-10-CM | POA: Diagnosis not present

## 2018-06-19 DIAGNOSIS — T8189XD Other complications of procedures, not elsewhere classified, subsequent encounter: Secondary | ICD-10-CM | POA: Diagnosis not present

## 2018-06-19 DIAGNOSIS — J441 Chronic obstructive pulmonary disease with (acute) exacerbation: Secondary | ICD-10-CM | POA: Diagnosis not present

## 2018-06-19 DIAGNOSIS — J449 Chronic obstructive pulmonary disease, unspecified: Secondary | ICD-10-CM | POA: Diagnosis not present

## 2018-06-19 DIAGNOSIS — I5042 Chronic combined systolic (congestive) and diastolic (congestive) heart failure: Secondary | ICD-10-CM | POA: Diagnosis not present

## 2018-06-19 DIAGNOSIS — G8929 Other chronic pain: Secondary | ICD-10-CM | POA: Diagnosis not present

## 2018-06-19 DIAGNOSIS — F329 Major depressive disorder, single episode, unspecified: Secondary | ICD-10-CM | POA: Diagnosis not present

## 2018-06-19 DIAGNOSIS — F411 Generalized anxiety disorder: Secondary | ICD-10-CM | POA: Diagnosis not present

## 2018-06-19 DIAGNOSIS — I5030 Unspecified diastolic (congestive) heart failure: Secondary | ICD-10-CM | POA: Diagnosis not present

## 2018-06-19 DIAGNOSIS — M5489 Other dorsalgia: Secondary | ICD-10-CM | POA: Diagnosis not present

## 2018-06-22 DIAGNOSIS — F411 Generalized anxiety disorder: Secondary | ICD-10-CM | POA: Diagnosis not present

## 2018-06-22 DIAGNOSIS — I5030 Unspecified diastolic (congestive) heart failure: Secondary | ICD-10-CM | POA: Diagnosis not present

## 2018-06-22 DIAGNOSIS — J449 Chronic obstructive pulmonary disease, unspecified: Secondary | ICD-10-CM | POA: Diagnosis not present

## 2018-06-22 DIAGNOSIS — G8929 Other chronic pain: Secondary | ICD-10-CM | POA: Diagnosis not present

## 2018-06-22 DIAGNOSIS — I110 Hypertensive heart disease with heart failure: Secondary | ICD-10-CM | POA: Diagnosis not present

## 2018-06-22 DIAGNOSIS — Z0000 Encounter for general adult medical examination without abnormal findings: Secondary | ICD-10-CM | POA: Diagnosis not present

## 2018-06-22 DIAGNOSIS — Z6836 Body mass index (BMI) 36.0-36.9, adult: Secondary | ICD-10-CM | POA: Diagnosis not present

## 2018-06-22 DIAGNOSIS — F329 Major depressive disorder, single episode, unspecified: Secondary | ICD-10-CM | POA: Diagnosis not present

## 2018-06-22 DIAGNOSIS — M4712 Other spondylosis with myelopathy, cervical region: Secondary | ICD-10-CM | POA: Diagnosis not present

## 2018-06-22 DIAGNOSIS — M5489 Other dorsalgia: Secondary | ICD-10-CM | POA: Diagnosis not present

## 2018-06-22 DIAGNOSIS — T8189XD Other complications of procedures, not elsewhere classified, subsequent encounter: Secondary | ICD-10-CM | POA: Diagnosis not present

## 2018-06-23 ENCOUNTER — Other Ambulatory Visit: Payer: Self-pay | Admitting: *Deleted

## 2018-06-23 NOTE — Patient Outreach (Signed)
[facility]) Care Management  06/23/2018  Edward Andrews 05-21-1951 [id number]   Transition of Care Referral   Referral Date: 06/19/18 [facility] Date of Admission: He reports he spent about 5 weeks at the facility Diagnosis:  Date of Discharge: 06/16/18 [facility]  Insurance: [employer]   Outreach attempt # 1 left voice message at the mobile and home numbers but Edward Andrews [facility] CM   Patient is able to verify HIPAA Reviewed and addressed Transitional of care referral with patient Transition of care completed without identified needs and Edward Andrews voiced appreciation of the [facility]  Social: he lives at home with his wife Edward Andrews and is being seen [facility] He has been seen twice since arrival home and has 3 more weeks of services He takes care of himself and has no transportation issues to MD appointments  Conditions: Asthma, hyperlipidemia, GERD, COPD, anxiety,  Neuropathy, HF   Medications: he denies concerns with taking medications as prescribed, affording medications, side effects of medications and questions about medications   Appointments: He has seen Dr Olena LeatherwoodHasanaj his primary MD on 06/22/28 already   Advance directives: He denies concerns with taking medications as prescribed, affording medications, side effects of medications and questions about medications   Consent: [facility].  [facility] CM, pharmacy or SW at this time   Advised patient that other post discharge calls may occur to assess how the patient is doing following the recent hospitalization. Patient voiced understanding and was appreciative of f/u call.  [facility] will close case at this time as patient has been assessed and no needs identified.    [name], RN, BSN,  CCM [facility] Telephonic Care Management Care Coordinator Direct number [phone]  [facility] number [phone] Fax number [phone]

## 2018-06-28 DIAGNOSIS — M4712 Other spondylosis with myelopathy, cervical region: Secondary | ICD-10-CM | POA: Diagnosis not present

## 2018-06-28 DIAGNOSIS — J449 Chronic obstructive pulmonary disease, unspecified: Secondary | ICD-10-CM | POA: Diagnosis not present

## 2018-06-28 DIAGNOSIS — F411 Generalized anxiety disorder: Secondary | ICD-10-CM | POA: Diagnosis not present

## 2018-06-28 DIAGNOSIS — M5489 Other dorsalgia: Secondary | ICD-10-CM | POA: Diagnosis not present

## 2018-06-28 DIAGNOSIS — I5030 Unspecified diastolic (congestive) heart failure: Secondary | ICD-10-CM | POA: Diagnosis not present

## 2018-06-28 DIAGNOSIS — T8189XD Other complications of procedures, not elsewhere classified, subsequent encounter: Secondary | ICD-10-CM | POA: Diagnosis not present

## 2018-06-28 DIAGNOSIS — G8929 Other chronic pain: Secondary | ICD-10-CM | POA: Diagnosis not present

## 2018-06-28 DIAGNOSIS — I110 Hypertensive heart disease with heart failure: Secondary | ICD-10-CM | POA: Diagnosis not present

## 2018-06-28 DIAGNOSIS — F329 Major depressive disorder, single episode, unspecified: Secondary | ICD-10-CM | POA: Diagnosis not present

## 2018-07-05 DIAGNOSIS — F329 Major depressive disorder, single episode, unspecified: Secondary | ICD-10-CM | POA: Diagnosis not present

## 2018-07-05 DIAGNOSIS — M4712 Other spondylosis with myelopathy, cervical region: Secondary | ICD-10-CM | POA: Diagnosis not present

## 2018-07-05 DIAGNOSIS — F411 Generalized anxiety disorder: Secondary | ICD-10-CM | POA: Diagnosis not present

## 2018-07-05 DIAGNOSIS — G8929 Other chronic pain: Secondary | ICD-10-CM | POA: Diagnosis not present

## 2018-07-05 DIAGNOSIS — J449 Chronic obstructive pulmonary disease, unspecified: Secondary | ICD-10-CM | POA: Diagnosis not present

## 2018-07-05 DIAGNOSIS — I110 Hypertensive heart disease with heart failure: Secondary | ICD-10-CM | POA: Diagnosis not present

## 2018-07-05 DIAGNOSIS — I5030 Unspecified diastolic (congestive) heart failure: Secondary | ICD-10-CM | POA: Diagnosis not present

## 2018-07-05 DIAGNOSIS — T8189XD Other complications of procedures, not elsewhere classified, subsequent encounter: Secondary | ICD-10-CM | POA: Diagnosis not present

## 2018-07-05 DIAGNOSIS — M5489 Other dorsalgia: Secondary | ICD-10-CM | POA: Diagnosis not present

## 2018-07-11 DIAGNOSIS — G8929 Other chronic pain: Secondary | ICD-10-CM | POA: Diagnosis not present

## 2018-07-11 DIAGNOSIS — F411 Generalized anxiety disorder: Secondary | ICD-10-CM | POA: Diagnosis not present

## 2018-07-11 DIAGNOSIS — J449 Chronic obstructive pulmonary disease, unspecified: Secondary | ICD-10-CM | POA: Diagnosis not present

## 2018-07-11 DIAGNOSIS — I110 Hypertensive heart disease with heart failure: Secondary | ICD-10-CM | POA: Diagnosis not present

## 2018-07-11 DIAGNOSIS — T8189XD Other complications of procedures, not elsewhere classified, subsequent encounter: Secondary | ICD-10-CM | POA: Diagnosis not present

## 2018-07-11 DIAGNOSIS — M4712 Other spondylosis with myelopathy, cervical region: Secondary | ICD-10-CM | POA: Diagnosis not present

## 2018-07-11 DIAGNOSIS — I5030 Unspecified diastolic (congestive) heart failure: Secondary | ICD-10-CM | POA: Diagnosis not present

## 2018-07-11 DIAGNOSIS — M5489 Other dorsalgia: Secondary | ICD-10-CM | POA: Diagnosis not present

## 2018-07-11 DIAGNOSIS — F329 Major depressive disorder, single episode, unspecified: Secondary | ICD-10-CM | POA: Diagnosis not present

## 2018-07-12 DIAGNOSIS — R008 Other abnormalities of heart beat: Secondary | ICD-10-CM | POA: Diagnosis not present

## 2018-07-17 DIAGNOSIS — M6281 Muscle weakness (generalized): Secondary | ICD-10-CM | POA: Diagnosis not present

## 2018-07-17 DIAGNOSIS — J449 Chronic obstructive pulmonary disease, unspecified: Secondary | ICD-10-CM | POA: Diagnosis not present

## 2018-07-17 DIAGNOSIS — M4322 Fusion of spine, cervical region: Secondary | ICD-10-CM | POA: Diagnosis not present

## 2018-08-17 DIAGNOSIS — M4322 Fusion of spine, cervical region: Secondary | ICD-10-CM | POA: Diagnosis not present

## 2018-08-17 DIAGNOSIS — J449 Chronic obstructive pulmonary disease, unspecified: Secondary | ICD-10-CM | POA: Diagnosis not present

## 2018-08-17 DIAGNOSIS — M6281 Muscle weakness (generalized): Secondary | ICD-10-CM | POA: Diagnosis not present

## 2018-08-24 DIAGNOSIS — I10 Essential (primary) hypertension: Secondary | ICD-10-CM | POA: Diagnosis not present

## 2018-08-24 DIAGNOSIS — Z6836 Body mass index (BMI) 36.0-36.9, adult: Secondary | ICD-10-CM | POA: Diagnosis not present

## 2018-08-24 DIAGNOSIS — J441 Chronic obstructive pulmonary disease with (acute) exacerbation: Secondary | ICD-10-CM | POA: Diagnosis not present

## 2018-09-16 DIAGNOSIS — M6281 Muscle weakness (generalized): Secondary | ICD-10-CM | POA: Diagnosis not present

## 2018-09-16 DIAGNOSIS — M4322 Fusion of spine, cervical region: Secondary | ICD-10-CM | POA: Diagnosis not present

## 2018-09-16 DIAGNOSIS — J449 Chronic obstructive pulmonary disease, unspecified: Secondary | ICD-10-CM | POA: Diagnosis not present

## 2018-10-17 DIAGNOSIS — M6281 Muscle weakness (generalized): Secondary | ICD-10-CM | POA: Diagnosis not present

## 2018-10-17 DIAGNOSIS — M4322 Fusion of spine, cervical region: Secondary | ICD-10-CM | POA: Diagnosis not present

## 2018-10-17 DIAGNOSIS — J449 Chronic obstructive pulmonary disease, unspecified: Secondary | ICD-10-CM | POA: Diagnosis not present

## 2018-10-24 DIAGNOSIS — Z6836 Body mass index (BMI) 36.0-36.9, adult: Secondary | ICD-10-CM | POA: Diagnosis not present

## 2018-10-24 DIAGNOSIS — I10 Essential (primary) hypertension: Secondary | ICD-10-CM | POA: Diagnosis not present

## 2018-10-24 DIAGNOSIS — J449 Chronic obstructive pulmonary disease, unspecified: Secondary | ICD-10-CM | POA: Diagnosis not present

## 2018-11-16 DIAGNOSIS — M6281 Muscle weakness (generalized): Secondary | ICD-10-CM | POA: Diagnosis not present

## 2018-11-16 DIAGNOSIS — J449 Chronic obstructive pulmonary disease, unspecified: Secondary | ICD-10-CM | POA: Diagnosis not present

## 2018-11-16 DIAGNOSIS — M4322 Fusion of spine, cervical region: Secondary | ICD-10-CM | POA: Diagnosis not present

## 2018-12-17 DIAGNOSIS — J449 Chronic obstructive pulmonary disease, unspecified: Secondary | ICD-10-CM | POA: Diagnosis not present

## 2018-12-17 DIAGNOSIS — M6281 Muscle weakness (generalized): Secondary | ICD-10-CM | POA: Diagnosis not present

## 2018-12-17 DIAGNOSIS — M4322 Fusion of spine, cervical region: Secondary | ICD-10-CM | POA: Diagnosis not present

## 2018-12-18 DIAGNOSIS — M6281 Muscle weakness (generalized): Secondary | ICD-10-CM | POA: Diagnosis not present

## 2019-01-03 DIAGNOSIS — Z4801 Encounter for change or removal of surgical wound dressing: Secondary | ICD-10-CM | POA: Diagnosis not present

## 2019-01-03 DIAGNOSIS — B964 Proteus (mirabilis) (morganii) as the cause of diseases classified elsewhere: Secondary | ICD-10-CM | POA: Diagnosis not present

## 2019-01-03 DIAGNOSIS — I509 Heart failure, unspecified: Secondary | ICD-10-CM | POA: Diagnosis not present

## 2019-01-03 DIAGNOSIS — L97919 Non-pressure chronic ulcer of unspecified part of right lower leg with unspecified severity: Secondary | ICD-10-CM | POA: Diagnosis not present

## 2019-01-03 DIAGNOSIS — Z7401 Bed confinement status: Secondary | ICD-10-CM | POA: Diagnosis not present

## 2019-01-03 DIAGNOSIS — L03115 Cellulitis of right lower limb: Secondary | ICD-10-CM | POA: Diagnosis not present

## 2019-01-03 DIAGNOSIS — I110 Hypertensive heart disease with heart failure: Secondary | ICD-10-CM | POA: Diagnosis not present

## 2019-01-03 DIAGNOSIS — R0602 Shortness of breath: Secondary | ICD-10-CM | POA: Diagnosis not present

## 2019-01-03 DIAGNOSIS — R2689 Other abnormalities of gait and mobility: Secondary | ICD-10-CM | POA: Diagnosis not present

## 2019-01-03 DIAGNOSIS — R0689 Other abnormalities of breathing: Secondary | ICD-10-CM | POA: Diagnosis not present

## 2019-01-03 DIAGNOSIS — M6281 Muscle weakness (generalized): Secondary | ICD-10-CM | POA: Diagnosis not present

## 2019-01-03 DIAGNOSIS — I480 Paroxysmal atrial fibrillation: Secondary | ICD-10-CM | POA: Diagnosis not present

## 2019-01-03 DIAGNOSIS — I739 Peripheral vascular disease, unspecified: Secondary | ICD-10-CM | POA: Diagnosis not present

## 2019-01-03 DIAGNOSIS — E871 Hypo-osmolality and hyponatremia: Secondary | ICD-10-CM | POA: Diagnosis not present

## 2019-01-03 DIAGNOSIS — R52 Pain, unspecified: Secondary | ICD-10-CM | POA: Diagnosis not present

## 2019-01-03 DIAGNOSIS — B951 Streptococcus, group B, as the cause of diseases classified elsewhere: Secondary | ICD-10-CM | POA: Diagnosis not present

## 2019-01-03 DIAGNOSIS — R531 Weakness: Secondary | ICD-10-CM | POA: Diagnosis not present

## 2019-01-03 DIAGNOSIS — R000 Tachycardia, unspecified: Secondary | ICD-10-CM | POA: Diagnosis not present

## 2019-01-03 DIAGNOSIS — N179 Acute kidney failure, unspecified: Secondary | ICD-10-CM | POA: Diagnosis not present

## 2019-01-03 DIAGNOSIS — Z48817 Encounter for surgical aftercare following surgery on the skin and subcutaneous tissue: Secondary | ICD-10-CM | POA: Diagnosis not present

## 2019-01-03 DIAGNOSIS — J441 Chronic obstructive pulmonary disease with (acute) exacerbation: Secondary | ICD-10-CM | POA: Diagnosis not present

## 2019-01-03 DIAGNOSIS — L97929 Non-pressure chronic ulcer of unspecified part of left lower leg with unspecified severity: Secondary | ICD-10-CM | POA: Diagnosis not present

## 2019-01-03 DIAGNOSIS — I4891 Unspecified atrial fibrillation: Secondary | ICD-10-CM | POA: Diagnosis not present

## 2019-01-03 DIAGNOSIS — M79662 Pain in left lower leg: Secondary | ICD-10-CM | POA: Diagnosis not present

## 2019-01-03 DIAGNOSIS — Z720 Tobacco use: Secondary | ICD-10-CM | POA: Diagnosis not present

## 2019-01-03 DIAGNOSIS — J189 Pneumonia, unspecified organism: Secondary | ICD-10-CM | POA: Diagnosis not present

## 2019-01-03 DIAGNOSIS — L03116 Cellulitis of left lower limb: Secondary | ICD-10-CM | POA: Diagnosis not present

## 2019-01-03 DIAGNOSIS — L89152 Pressure ulcer of sacral region, stage 2: Secondary | ICD-10-CM | POA: Diagnosis not present

## 2019-01-03 DIAGNOSIS — M79661 Pain in right lower leg: Secondary | ICD-10-CM | POA: Diagnosis not present

## 2019-01-03 DIAGNOSIS — L89320 Pressure ulcer of left buttock, unstageable: Secondary | ICD-10-CM | POA: Diagnosis not present

## 2019-01-03 DIAGNOSIS — I872 Venous insufficiency (chronic) (peripheral): Secondary | ICD-10-CM | POA: Diagnosis not present

## 2019-01-03 DIAGNOSIS — I5032 Chronic diastolic (congestive) heart failure: Secondary | ICD-10-CM | POA: Diagnosis not present

## 2019-01-03 DIAGNOSIS — L03112 Cellulitis of left axilla: Secondary | ICD-10-CM | POA: Diagnosis not present

## 2019-01-03 DIAGNOSIS — M7989 Other specified soft tissue disorders: Secondary | ICD-10-CM | POA: Diagnosis not present

## 2019-01-04 DIAGNOSIS — B951 Streptococcus, group B, as the cause of diseases classified elsewhere: Secondary | ICD-10-CM | POA: Diagnosis not present

## 2019-01-04 DIAGNOSIS — L03116 Cellulitis of left lower limb: Secondary | ICD-10-CM | POA: Diagnosis not present

## 2019-01-04 DIAGNOSIS — I110 Hypertensive heart disease with heart failure: Secondary | ICD-10-CM | POA: Diagnosis not present

## 2019-01-04 DIAGNOSIS — L03115 Cellulitis of right lower limb: Secondary | ICD-10-CM | POA: Diagnosis not present

## 2019-01-04 DIAGNOSIS — J441 Chronic obstructive pulmonary disease with (acute) exacerbation: Secondary | ICD-10-CM | POA: Diagnosis not present

## 2019-01-04 DIAGNOSIS — L89152 Pressure ulcer of sacral region, stage 2: Secondary | ICD-10-CM | POA: Diagnosis not present

## 2019-01-04 DIAGNOSIS — I5032 Chronic diastolic (congestive) heart failure: Secondary | ICD-10-CM | POA: Diagnosis not present

## 2019-01-04 DIAGNOSIS — B964 Proteus (mirabilis) (morganii) as the cause of diseases classified elsewhere: Secondary | ICD-10-CM | POA: Diagnosis not present

## 2019-01-04 DIAGNOSIS — E871 Hypo-osmolality and hyponatremia: Secondary | ICD-10-CM | POA: Diagnosis not present

## 2019-01-05 DIAGNOSIS — I110 Hypertensive heart disease with heart failure: Secondary | ICD-10-CM | POA: Diagnosis not present

## 2019-01-05 DIAGNOSIS — L03115 Cellulitis of right lower limb: Secondary | ICD-10-CM | POA: Diagnosis not present

## 2019-01-05 DIAGNOSIS — B951 Streptococcus, group B, as the cause of diseases classified elsewhere: Secondary | ICD-10-CM | POA: Diagnosis not present

## 2019-01-05 DIAGNOSIS — L89152 Pressure ulcer of sacral region, stage 2: Secondary | ICD-10-CM | POA: Diagnosis not present

## 2019-01-05 DIAGNOSIS — J441 Chronic obstructive pulmonary disease with (acute) exacerbation: Secondary | ICD-10-CM | POA: Diagnosis not present

## 2019-01-05 DIAGNOSIS — I5032 Chronic diastolic (congestive) heart failure: Secondary | ICD-10-CM | POA: Diagnosis not present

## 2019-01-05 DIAGNOSIS — B964 Proteus (mirabilis) (morganii) as the cause of diseases classified elsewhere: Secondary | ICD-10-CM | POA: Diagnosis not present

## 2019-01-05 DIAGNOSIS — E871 Hypo-osmolality and hyponatremia: Secondary | ICD-10-CM | POA: Diagnosis not present

## 2019-01-05 DIAGNOSIS — L03116 Cellulitis of left lower limb: Secondary | ICD-10-CM | POA: Diagnosis not present

## 2019-01-06 DIAGNOSIS — L03116 Cellulitis of left lower limb: Secondary | ICD-10-CM | POA: Diagnosis not present

## 2019-01-06 DIAGNOSIS — I110 Hypertensive heart disease with heart failure: Secondary | ICD-10-CM | POA: Diagnosis not present

## 2019-01-06 DIAGNOSIS — B964 Proteus (mirabilis) (morganii) as the cause of diseases classified elsewhere: Secondary | ICD-10-CM | POA: Diagnosis not present

## 2019-01-06 DIAGNOSIS — J441 Chronic obstructive pulmonary disease with (acute) exacerbation: Secondary | ICD-10-CM | POA: Diagnosis not present

## 2019-01-06 DIAGNOSIS — E871 Hypo-osmolality and hyponatremia: Secondary | ICD-10-CM | POA: Diagnosis not present

## 2019-01-06 DIAGNOSIS — I5032 Chronic diastolic (congestive) heart failure: Secondary | ICD-10-CM | POA: Diagnosis not present

## 2019-01-06 DIAGNOSIS — B951 Streptococcus, group B, as the cause of diseases classified elsewhere: Secondary | ICD-10-CM | POA: Diagnosis not present

## 2019-01-06 DIAGNOSIS — L03115 Cellulitis of right lower limb: Secondary | ICD-10-CM | POA: Diagnosis not present

## 2019-01-06 DIAGNOSIS — L89152 Pressure ulcer of sacral region, stage 2: Secondary | ICD-10-CM | POA: Diagnosis not present

## 2019-01-07 DIAGNOSIS — L03115 Cellulitis of right lower limb: Secondary | ICD-10-CM | POA: Diagnosis not present

## 2019-01-07 DIAGNOSIS — B964 Proteus (mirabilis) (morganii) as the cause of diseases classified elsewhere: Secondary | ICD-10-CM | POA: Diagnosis not present

## 2019-01-07 DIAGNOSIS — L89152 Pressure ulcer of sacral region, stage 2: Secondary | ICD-10-CM | POA: Diagnosis not present

## 2019-01-07 DIAGNOSIS — E871 Hypo-osmolality and hyponatremia: Secondary | ICD-10-CM | POA: Diagnosis not present

## 2019-01-07 DIAGNOSIS — J441 Chronic obstructive pulmonary disease with (acute) exacerbation: Secondary | ICD-10-CM | POA: Diagnosis not present

## 2019-01-07 DIAGNOSIS — L03116 Cellulitis of left lower limb: Secondary | ICD-10-CM | POA: Diagnosis not present

## 2019-01-07 DIAGNOSIS — I5032 Chronic diastolic (congestive) heart failure: Secondary | ICD-10-CM | POA: Diagnosis not present

## 2019-01-07 DIAGNOSIS — B951 Streptococcus, group B, as the cause of diseases classified elsewhere: Secondary | ICD-10-CM | POA: Diagnosis not present

## 2019-01-07 DIAGNOSIS — I110 Hypertensive heart disease with heart failure: Secondary | ICD-10-CM | POA: Diagnosis not present

## 2019-01-08 DIAGNOSIS — L03115 Cellulitis of right lower limb: Secondary | ICD-10-CM | POA: Diagnosis not present

## 2019-01-08 DIAGNOSIS — L03116 Cellulitis of left lower limb: Secondary | ICD-10-CM | POA: Diagnosis not present

## 2019-01-08 DIAGNOSIS — E871 Hypo-osmolality and hyponatremia: Secondary | ICD-10-CM | POA: Diagnosis not present

## 2019-01-08 DIAGNOSIS — J441 Chronic obstructive pulmonary disease with (acute) exacerbation: Secondary | ICD-10-CM | POA: Diagnosis not present

## 2019-01-08 DIAGNOSIS — B951 Streptococcus, group B, as the cause of diseases classified elsewhere: Secondary | ICD-10-CM | POA: Diagnosis not present

## 2019-01-08 DIAGNOSIS — B964 Proteus (mirabilis) (morganii) as the cause of diseases classified elsewhere: Secondary | ICD-10-CM | POA: Diagnosis not present

## 2019-01-08 DIAGNOSIS — I110 Hypertensive heart disease with heart failure: Secondary | ICD-10-CM | POA: Diagnosis not present

## 2019-01-08 DIAGNOSIS — L89152 Pressure ulcer of sacral region, stage 2: Secondary | ICD-10-CM | POA: Diagnosis not present

## 2019-01-08 DIAGNOSIS — I5032 Chronic diastolic (congestive) heart failure: Secondary | ICD-10-CM | POA: Diagnosis not present

## 2019-01-09 DIAGNOSIS — L03115 Cellulitis of right lower limb: Secondary | ICD-10-CM | POA: Diagnosis not present

## 2019-01-09 DIAGNOSIS — I110 Hypertensive heart disease with heart failure: Secondary | ICD-10-CM | POA: Diagnosis not present

## 2019-01-09 DIAGNOSIS — J441 Chronic obstructive pulmonary disease with (acute) exacerbation: Secondary | ICD-10-CM | POA: Diagnosis not present

## 2019-01-09 DIAGNOSIS — B951 Streptococcus, group B, as the cause of diseases classified elsewhere: Secondary | ICD-10-CM | POA: Diagnosis not present

## 2019-01-09 DIAGNOSIS — B964 Proteus (mirabilis) (morganii) as the cause of diseases classified elsewhere: Secondary | ICD-10-CM | POA: Diagnosis not present

## 2019-01-09 DIAGNOSIS — E871 Hypo-osmolality and hyponatremia: Secondary | ICD-10-CM | POA: Diagnosis not present

## 2019-01-09 DIAGNOSIS — I5032 Chronic diastolic (congestive) heart failure: Secondary | ICD-10-CM | POA: Diagnosis not present

## 2019-01-09 DIAGNOSIS — L03116 Cellulitis of left lower limb: Secondary | ICD-10-CM | POA: Diagnosis not present

## 2019-01-09 DIAGNOSIS — L89152 Pressure ulcer of sacral region, stage 2: Secondary | ICD-10-CM | POA: Diagnosis not present

## 2019-01-10 DIAGNOSIS — J441 Chronic obstructive pulmonary disease with (acute) exacerbation: Secondary | ICD-10-CM | POA: Diagnosis not present

## 2019-01-10 DIAGNOSIS — L03116 Cellulitis of left lower limb: Secondary | ICD-10-CM | POA: Diagnosis not present

## 2019-01-10 DIAGNOSIS — I110 Hypertensive heart disease with heart failure: Secondary | ICD-10-CM | POA: Diagnosis not present

## 2019-01-10 DIAGNOSIS — L89152 Pressure ulcer of sacral region, stage 2: Secondary | ICD-10-CM | POA: Diagnosis not present

## 2019-01-10 DIAGNOSIS — I5032 Chronic diastolic (congestive) heart failure: Secondary | ICD-10-CM | POA: Diagnosis not present

## 2019-01-10 DIAGNOSIS — L03115 Cellulitis of right lower limb: Secondary | ICD-10-CM | POA: Diagnosis not present

## 2019-01-10 DIAGNOSIS — B964 Proteus (mirabilis) (morganii) as the cause of diseases classified elsewhere: Secondary | ICD-10-CM | POA: Diagnosis not present

## 2019-01-10 DIAGNOSIS — E871 Hypo-osmolality and hyponatremia: Secondary | ICD-10-CM | POA: Diagnosis not present

## 2019-01-10 DIAGNOSIS — B951 Streptococcus, group B, as the cause of diseases classified elsewhere: Secondary | ICD-10-CM | POA: Diagnosis not present

## 2019-01-11 DIAGNOSIS — E871 Hypo-osmolality and hyponatremia: Secondary | ICD-10-CM | POA: Diagnosis not present

## 2019-01-11 DIAGNOSIS — I110 Hypertensive heart disease with heart failure: Secondary | ICD-10-CM | POA: Diagnosis not present

## 2019-01-11 DIAGNOSIS — Z48817 Encounter for surgical aftercare following surgery on the skin and subcutaneous tissue: Secondary | ICD-10-CM | POA: Diagnosis not present

## 2019-01-11 DIAGNOSIS — R404 Transient alteration of awareness: Secondary | ICD-10-CM | POA: Diagnosis not present

## 2019-01-11 DIAGNOSIS — I5043 Acute on chronic combined systolic (congestive) and diastolic (congestive) heart failure: Secondary | ICD-10-CM | POA: Diagnosis not present

## 2019-01-11 DIAGNOSIS — R2689 Other abnormalities of gait and mobility: Secondary | ICD-10-CM | POA: Diagnosis not present

## 2019-01-11 DIAGNOSIS — L03115 Cellulitis of right lower limb: Secondary | ICD-10-CM | POA: Diagnosis not present

## 2019-01-11 DIAGNOSIS — L89152 Pressure ulcer of sacral region, stage 2: Secondary | ICD-10-CM | POA: Diagnosis not present

## 2019-01-11 DIAGNOSIS — R0603 Acute respiratory distress: Secondary | ICD-10-CM | POA: Diagnosis not present

## 2019-01-11 DIAGNOSIS — Z87891 Personal history of nicotine dependence: Secondary | ICD-10-CM | POA: Diagnosis not present

## 2019-01-11 DIAGNOSIS — Z7401 Bed confinement status: Secondary | ICD-10-CM | POA: Diagnosis not present

## 2019-01-11 DIAGNOSIS — M6281 Muscle weakness (generalized): Secondary | ICD-10-CM | POA: Diagnosis not present

## 2019-01-11 DIAGNOSIS — Z6841 Body Mass Index (BMI) 40.0 and over, adult: Secondary | ICD-10-CM | POA: Diagnosis not present

## 2019-01-11 DIAGNOSIS — J441 Chronic obstructive pulmonary disease with (acute) exacerbation: Secondary | ICD-10-CM | POA: Diagnosis not present

## 2019-01-11 DIAGNOSIS — J9601 Acute respiratory failure with hypoxia: Secondary | ICD-10-CM | POA: Diagnosis not present

## 2019-01-11 DIAGNOSIS — I5032 Chronic diastolic (congestive) heart failure: Secondary | ICD-10-CM | POA: Diagnosis not present

## 2019-01-11 DIAGNOSIS — J9602 Acute respiratory failure with hypercapnia: Secondary | ICD-10-CM | POA: Diagnosis not present

## 2019-01-11 DIAGNOSIS — Z4801 Encounter for change or removal of surgical wound dressing: Secondary | ICD-10-CM | POA: Diagnosis not present

## 2019-01-11 DIAGNOSIS — R319 Hematuria, unspecified: Secondary | ICD-10-CM | POA: Diagnosis not present

## 2019-01-11 DIAGNOSIS — R531 Weakness: Secondary | ICD-10-CM | POA: Diagnosis not present

## 2019-01-11 DIAGNOSIS — B951 Streptococcus, group B, as the cause of diseases classified elsewhere: Secondary | ICD-10-CM | POA: Diagnosis not present

## 2019-01-11 DIAGNOSIS — B964 Proteus (mirabilis) (morganii) as the cause of diseases classified elsewhere: Secondary | ICD-10-CM | POA: Diagnosis not present

## 2019-01-11 DIAGNOSIS — R069 Unspecified abnormalities of breathing: Secondary | ICD-10-CM | POA: Diagnosis not present

## 2019-01-11 DIAGNOSIS — R0902 Hypoxemia: Secondary | ICD-10-CM | POA: Diagnosis not present

## 2019-01-11 DIAGNOSIS — J449 Chronic obstructive pulmonary disease, unspecified: Secondary | ICD-10-CM | POA: Diagnosis not present

## 2019-01-11 DIAGNOSIS — R0689 Other abnormalities of breathing: Secondary | ICD-10-CM | POA: Diagnosis not present

## 2019-01-11 DIAGNOSIS — J811 Chronic pulmonary edema: Secondary | ICD-10-CM | POA: Diagnosis not present

## 2019-01-11 DIAGNOSIS — J189 Pneumonia, unspecified organism: Secondary | ICD-10-CM | POA: Diagnosis not present

## 2019-01-11 DIAGNOSIS — L0390 Cellulitis, unspecified: Secondary | ICD-10-CM | POA: Diagnosis not present

## 2019-01-11 DIAGNOSIS — L03116 Cellulitis of left lower limb: Secondary | ICD-10-CM | POA: Diagnosis not present

## 2019-01-11 DIAGNOSIS — J80 Acute respiratory distress syndrome: Secondary | ICD-10-CM | POA: Diagnosis not present

## 2019-01-11 DIAGNOSIS — I482 Chronic atrial fibrillation, unspecified: Secondary | ICD-10-CM | POA: Diagnosis not present

## 2019-01-12 DIAGNOSIS — R319 Hematuria, unspecified: Secondary | ICD-10-CM | POA: Diagnosis not present

## 2019-01-12 DIAGNOSIS — L0390 Cellulitis, unspecified: Secondary | ICD-10-CM | POA: Diagnosis not present

## 2019-01-12 DIAGNOSIS — E871 Hypo-osmolality and hyponatremia: Secondary | ICD-10-CM | POA: Diagnosis not present

## 2019-01-12 DIAGNOSIS — J449 Chronic obstructive pulmonary disease, unspecified: Secondary | ICD-10-CM | POA: Diagnosis not present

## 2019-01-16 DIAGNOSIS — Z87891 Personal history of nicotine dependence: Secondary | ICD-10-CM | POA: Diagnosis not present

## 2019-01-16 DIAGNOSIS — I5043 Acute on chronic combined systolic (congestive) and diastolic (congestive) heart failure: Secondary | ICD-10-CM | POA: Diagnosis not present

## 2019-01-16 DIAGNOSIS — L03115 Cellulitis of right lower limb: Secondary | ICD-10-CM | POA: Diagnosis not present

## 2019-01-16 DIAGNOSIS — M4322 Fusion of spine, cervical region: Secondary | ICD-10-CM | POA: Diagnosis not present

## 2019-01-16 DIAGNOSIS — Z6841 Body Mass Index (BMI) 40.0 and over, adult: Secondary | ICD-10-CM | POA: Diagnosis not present

## 2019-01-16 DIAGNOSIS — R069 Unspecified abnormalities of breathing: Secondary | ICD-10-CM | POA: Diagnosis not present

## 2019-01-16 DIAGNOSIS — R0603 Acute respiratory distress: Secondary | ICD-10-CM | POA: Diagnosis not present

## 2019-01-16 DIAGNOSIS — J9602 Acute respiratory failure with hypercapnia: Secondary | ICD-10-CM | POA: Diagnosis not present

## 2019-01-16 DIAGNOSIS — I110 Hypertensive heart disease with heart failure: Secondary | ICD-10-CM | POA: Diagnosis not present

## 2019-01-16 DIAGNOSIS — M2550 Pain in unspecified joint: Secondary | ICD-10-CM | POA: Diagnosis not present

## 2019-01-16 DIAGNOSIS — Z7401 Bed confinement status: Secondary | ICD-10-CM | POA: Diagnosis not present

## 2019-01-16 DIAGNOSIS — R404 Transient alteration of awareness: Secondary | ICD-10-CM | POA: Diagnosis not present

## 2019-01-16 DIAGNOSIS — J189 Pneumonia, unspecified organism: Secondary | ICD-10-CM | POA: Diagnosis not present

## 2019-01-16 DIAGNOSIS — J9601 Acute respiratory failure with hypoxia: Secondary | ICD-10-CM | POA: Diagnosis not present

## 2019-01-16 DIAGNOSIS — R0902 Hypoxemia: Secondary | ICD-10-CM | POA: Diagnosis not present

## 2019-01-16 DIAGNOSIS — J80 Acute respiratory distress syndrome: Secondary | ICD-10-CM | POA: Diagnosis not present

## 2019-01-16 DIAGNOSIS — R0689 Other abnormalities of breathing: Secondary | ICD-10-CM | POA: Diagnosis not present

## 2019-01-16 DIAGNOSIS — J449 Chronic obstructive pulmonary disease, unspecified: Secondary | ICD-10-CM | POA: Diagnosis not present

## 2019-01-16 DIAGNOSIS — M6281 Muscle weakness (generalized): Secondary | ICD-10-CM | POA: Diagnosis not present

## 2019-01-16 DIAGNOSIS — I482 Chronic atrial fibrillation, unspecified: Secondary | ICD-10-CM | POA: Diagnosis not present

## 2019-01-16 DIAGNOSIS — L03116 Cellulitis of left lower limb: Secondary | ICD-10-CM | POA: Diagnosis not present

## 2019-01-16 DIAGNOSIS — J441 Chronic obstructive pulmonary disease with (acute) exacerbation: Secondary | ICD-10-CM | POA: Diagnosis not present

## 2019-02-02 DIAGNOSIS — J441 Chronic obstructive pulmonary disease with (acute) exacerbation: Secondary | ICD-10-CM | POA: Diagnosis not present

## 2019-02-02 DIAGNOSIS — I110 Hypertensive heart disease with heart failure: Secondary | ICD-10-CM | POA: Diagnosis not present

## 2019-02-02 DIAGNOSIS — S81801D Unspecified open wound, right lower leg, subsequent encounter: Secondary | ICD-10-CM | POA: Diagnosis not present

## 2019-02-02 DIAGNOSIS — I482 Chronic atrial fibrillation, unspecified: Secondary | ICD-10-CM | POA: Diagnosis not present

## 2019-02-02 DIAGNOSIS — I5041 Acute combined systolic (congestive) and diastolic (congestive) heart failure: Secondary | ICD-10-CM | POA: Diagnosis not present

## 2019-02-02 DIAGNOSIS — L03115 Cellulitis of right lower limb: Secondary | ICD-10-CM | POA: Diagnosis not present

## 2019-02-02 DIAGNOSIS — L03116 Cellulitis of left lower limb: Secondary | ICD-10-CM | POA: Diagnosis not present

## 2019-02-02 DIAGNOSIS — S81802D Unspecified open wound, left lower leg, subsequent encounter: Secondary | ICD-10-CM | POA: Diagnosis not present

## 2019-02-02 DIAGNOSIS — L89620 Pressure ulcer of left heel, unstageable: Secondary | ICD-10-CM | POA: Diagnosis not present

## 2019-02-05 DIAGNOSIS — S81802D Unspecified open wound, left lower leg, subsequent encounter: Secondary | ICD-10-CM | POA: Diagnosis not present

## 2019-02-05 DIAGNOSIS — J441 Chronic obstructive pulmonary disease with (acute) exacerbation: Secondary | ICD-10-CM | POA: Diagnosis not present

## 2019-02-05 DIAGNOSIS — I110 Hypertensive heart disease with heart failure: Secondary | ICD-10-CM | POA: Diagnosis not present

## 2019-02-05 DIAGNOSIS — L89620 Pressure ulcer of left heel, unstageable: Secondary | ICD-10-CM | POA: Diagnosis not present

## 2019-02-05 DIAGNOSIS — S81801D Unspecified open wound, right lower leg, subsequent encounter: Secondary | ICD-10-CM | POA: Diagnosis not present

## 2019-02-05 DIAGNOSIS — I5041 Acute combined systolic (congestive) and diastolic (congestive) heart failure: Secondary | ICD-10-CM | POA: Diagnosis not present

## 2019-02-05 DIAGNOSIS — I482 Chronic atrial fibrillation, unspecified: Secondary | ICD-10-CM | POA: Diagnosis not present

## 2019-02-05 DIAGNOSIS — L03116 Cellulitis of left lower limb: Secondary | ICD-10-CM | POA: Diagnosis not present

## 2019-02-05 DIAGNOSIS — L03115 Cellulitis of right lower limb: Secondary | ICD-10-CM | POA: Diagnosis not present

## 2019-02-09 DIAGNOSIS — L03116 Cellulitis of left lower limb: Secondary | ICD-10-CM | POA: Diagnosis not present

## 2019-02-09 DIAGNOSIS — I5041 Acute combined systolic (congestive) and diastolic (congestive) heart failure: Secondary | ICD-10-CM | POA: Diagnosis not present

## 2019-02-09 DIAGNOSIS — I482 Chronic atrial fibrillation, unspecified: Secondary | ICD-10-CM | POA: Diagnosis not present

## 2019-02-09 DIAGNOSIS — S81802D Unspecified open wound, left lower leg, subsequent encounter: Secondary | ICD-10-CM | POA: Diagnosis not present

## 2019-02-09 DIAGNOSIS — I110 Hypertensive heart disease with heart failure: Secondary | ICD-10-CM | POA: Diagnosis not present

## 2019-02-09 DIAGNOSIS — L89620 Pressure ulcer of left heel, unstageable: Secondary | ICD-10-CM | POA: Diagnosis not present

## 2019-02-09 DIAGNOSIS — J441 Chronic obstructive pulmonary disease with (acute) exacerbation: Secondary | ICD-10-CM | POA: Diagnosis not present

## 2019-02-09 DIAGNOSIS — S81801D Unspecified open wound, right lower leg, subsequent encounter: Secondary | ICD-10-CM | POA: Diagnosis not present

## 2019-02-09 DIAGNOSIS — L03115 Cellulitis of right lower limb: Secondary | ICD-10-CM | POA: Diagnosis not present

## 2019-02-13 DIAGNOSIS — L03115 Cellulitis of right lower limb: Secondary | ICD-10-CM | POA: Diagnosis not present

## 2019-02-13 DIAGNOSIS — L03116 Cellulitis of left lower limb: Secondary | ICD-10-CM | POA: Diagnosis not present

## 2019-02-13 DIAGNOSIS — I5041 Acute combined systolic (congestive) and diastolic (congestive) heart failure: Secondary | ICD-10-CM | POA: Diagnosis not present

## 2019-02-13 DIAGNOSIS — I110 Hypertensive heart disease with heart failure: Secondary | ICD-10-CM | POA: Diagnosis not present

## 2019-02-13 DIAGNOSIS — S81801D Unspecified open wound, right lower leg, subsequent encounter: Secondary | ICD-10-CM | POA: Diagnosis not present

## 2019-02-13 DIAGNOSIS — J441 Chronic obstructive pulmonary disease with (acute) exacerbation: Secondary | ICD-10-CM | POA: Diagnosis not present

## 2019-02-13 DIAGNOSIS — L89620 Pressure ulcer of left heel, unstageable: Secondary | ICD-10-CM | POA: Diagnosis not present

## 2019-02-13 DIAGNOSIS — I482 Chronic atrial fibrillation, unspecified: Secondary | ICD-10-CM | POA: Diagnosis not present

## 2019-02-13 DIAGNOSIS — S81802D Unspecified open wound, left lower leg, subsequent encounter: Secondary | ICD-10-CM | POA: Diagnosis not present

## 2019-02-15 DIAGNOSIS — J449 Chronic obstructive pulmonary disease, unspecified: Secondary | ICD-10-CM | POA: Diagnosis not present

## 2019-02-15 DIAGNOSIS — M6281 Muscle weakness (generalized): Secondary | ICD-10-CM | POA: Diagnosis not present

## 2019-02-15 DIAGNOSIS — M4322 Fusion of spine, cervical region: Secondary | ICD-10-CM | POA: Diagnosis not present

## 2019-02-16 DIAGNOSIS — I5041 Acute combined systolic (congestive) and diastolic (congestive) heart failure: Secondary | ICD-10-CM | POA: Diagnosis not present

## 2019-02-16 DIAGNOSIS — I482 Chronic atrial fibrillation, unspecified: Secondary | ICD-10-CM | POA: Diagnosis not present

## 2019-02-16 DIAGNOSIS — S81801D Unspecified open wound, right lower leg, subsequent encounter: Secondary | ICD-10-CM | POA: Diagnosis not present

## 2019-02-16 DIAGNOSIS — S81802D Unspecified open wound, left lower leg, subsequent encounter: Secondary | ICD-10-CM | POA: Diagnosis not present

## 2019-02-16 DIAGNOSIS — L89620 Pressure ulcer of left heel, unstageable: Secondary | ICD-10-CM | POA: Diagnosis not present

## 2019-02-16 DIAGNOSIS — L03116 Cellulitis of left lower limb: Secondary | ICD-10-CM | POA: Diagnosis not present

## 2019-02-16 DIAGNOSIS — J441 Chronic obstructive pulmonary disease with (acute) exacerbation: Secondary | ICD-10-CM | POA: Diagnosis not present

## 2019-02-16 DIAGNOSIS — L03115 Cellulitis of right lower limb: Secondary | ICD-10-CM | POA: Diagnosis not present

## 2019-02-16 DIAGNOSIS — I110 Hypertensive heart disease with heart failure: Secondary | ICD-10-CM | POA: Diagnosis not present

## 2019-02-20 DIAGNOSIS — L03116 Cellulitis of left lower limb: Secondary | ICD-10-CM | POA: Diagnosis not present

## 2019-02-20 DIAGNOSIS — J449 Chronic obstructive pulmonary disease, unspecified: Secondary | ICD-10-CM | POA: Diagnosis not present

## 2019-02-20 DIAGNOSIS — L03115 Cellulitis of right lower limb: Secondary | ICD-10-CM | POA: Diagnosis not present

## 2019-02-20 DIAGNOSIS — M4322 Fusion of spine, cervical region: Secondary | ICD-10-CM | POA: Diagnosis not present

## 2019-02-20 DIAGNOSIS — M6281 Muscle weakness (generalized): Secondary | ICD-10-CM | POA: Diagnosis not present

## 2019-03-08 DIAGNOSIS — R531 Weakness: Secondary | ICD-10-CM | POA: Diagnosis not present

## 2019-03-08 DIAGNOSIS — Z743 Need for continuous supervision: Secondary | ICD-10-CM | POA: Diagnosis not present

## 2019-03-08 DIAGNOSIS — J449 Chronic obstructive pulmonary disease, unspecified: Secondary | ICD-10-CM | POA: Diagnosis not present

## 2019-03-08 DIAGNOSIS — Z6836 Body mass index (BMI) 36.0-36.9, adult: Secondary | ICD-10-CM | POA: Diagnosis not present

## 2019-03-08 DIAGNOSIS — R29898 Other symptoms and signs involving the musculoskeletal system: Secondary | ICD-10-CM | POA: Diagnosis not present

## 2019-03-08 DIAGNOSIS — I10 Essential (primary) hypertension: Secondary | ICD-10-CM | POA: Diagnosis not present

## 2019-03-08 DIAGNOSIS — R52 Pain, unspecified: Secondary | ICD-10-CM | POA: Diagnosis not present

## 2019-03-08 DIAGNOSIS — M109 Gout, unspecified: Secondary | ICD-10-CM | POA: Diagnosis not present

## 2019-03-18 DIAGNOSIS — M4322 Fusion of spine, cervical region: Secondary | ICD-10-CM | POA: Diagnosis not present

## 2019-03-18 DIAGNOSIS — J449 Chronic obstructive pulmonary disease, unspecified: Secondary | ICD-10-CM | POA: Diagnosis not present

## 2019-03-18 DIAGNOSIS — M6281 Muscle weakness (generalized): Secondary | ICD-10-CM | POA: Diagnosis not present

## 2019-03-23 DIAGNOSIS — M6281 Muscle weakness (generalized): Secondary | ICD-10-CM | POA: Diagnosis not present

## 2019-03-23 DIAGNOSIS — M4322 Fusion of spine, cervical region: Secondary | ICD-10-CM | POA: Diagnosis not present

## 2019-03-23 DIAGNOSIS — L03116 Cellulitis of left lower limb: Secondary | ICD-10-CM | POA: Diagnosis not present

## 2019-03-23 DIAGNOSIS — L03115 Cellulitis of right lower limb: Secondary | ICD-10-CM | POA: Diagnosis not present

## 2019-03-23 DIAGNOSIS — J449 Chronic obstructive pulmonary disease, unspecified: Secondary | ICD-10-CM | POA: Diagnosis not present

## 2019-04-17 DIAGNOSIS — M4322 Fusion of spine, cervical region: Secondary | ICD-10-CM | POA: Diagnosis not present

## 2019-04-17 DIAGNOSIS — M6281 Muscle weakness (generalized): Secondary | ICD-10-CM | POA: Diagnosis not present

## 2019-04-17 DIAGNOSIS — J449 Chronic obstructive pulmonary disease, unspecified: Secondary | ICD-10-CM | POA: Diagnosis not present

## 2019-04-22 DIAGNOSIS — L03115 Cellulitis of right lower limb: Secondary | ICD-10-CM | POA: Diagnosis not present

## 2019-04-22 DIAGNOSIS — J449 Chronic obstructive pulmonary disease, unspecified: Secondary | ICD-10-CM | POA: Diagnosis not present

## 2019-04-22 DIAGNOSIS — M4322 Fusion of spine, cervical region: Secondary | ICD-10-CM | POA: Diagnosis not present

## 2019-04-22 DIAGNOSIS — L03116 Cellulitis of left lower limb: Secondary | ICD-10-CM | POA: Diagnosis not present

## 2019-04-22 DIAGNOSIS — M6281 Muscle weakness (generalized): Secondary | ICD-10-CM | POA: Diagnosis not present

## 2019-05-09 ENCOUNTER — Other Ambulatory Visit (HOSPITAL_COMMUNITY)
Admission: RE | Admit: 2019-05-09 | Discharge: 2019-05-09 | Disposition: A | Discharge status: Home / Self Care | Payer: Medicare HMO | Source: Ambulatory Visit | Attending: Internal Medicine | Admitting: Internal Medicine

## 2019-05-09 DIAGNOSIS — I10 Essential (primary) hypertension: Secondary | ICD-10-CM | POA: Diagnosis not present

## 2019-05-09 DIAGNOSIS — F17219 Nicotine dependence, cigarettes, with unspecified nicotine-induced disorders: Secondary | ICD-10-CM | POA: Diagnosis not present

## 2019-05-09 DIAGNOSIS — R600 Localized edema: Secondary | ICD-10-CM | POA: Diagnosis not present

## 2019-05-09 DIAGNOSIS — L89610 Pressure ulcer of right heel, unstageable: Secondary | ICD-10-CM | POA: Diagnosis not present

## 2019-05-09 DIAGNOSIS — E875 Hyperkalemia: Secondary | ICD-10-CM | POA: Diagnosis not present

## 2019-05-09 DIAGNOSIS — E669 Obesity, unspecified: Secondary | ICD-10-CM | POA: Diagnosis not present

## 2019-05-09 DIAGNOSIS — L989 Disorder of the skin and subcutaneous tissue, unspecified: Secondary | ICD-10-CM | POA: Diagnosis not present

## 2019-05-09 DIAGNOSIS — Z6836 Body mass index (BMI) 36.0-36.9, adult: Secondary | ICD-10-CM | POA: Diagnosis not present

## 2019-05-09 DIAGNOSIS — J449 Chronic obstructive pulmonary disease, unspecified: Secondary | ICD-10-CM | POA: Diagnosis not present

## 2019-05-09 DIAGNOSIS — L89892 Pressure ulcer of other site, stage 2: Secondary | ICD-10-CM | POA: Diagnosis not present

## 2019-05-09 DIAGNOSIS — Z4800 Encounter for change or removal of nonsurgical wound dressing: Secondary | ICD-10-CM | POA: Diagnosis not present

## 2019-05-09 DIAGNOSIS — M109 Gout, unspecified: Secondary | ICD-10-CM | POA: Diagnosis not present

## 2019-05-09 DIAGNOSIS — R7989 Other specified abnormal findings of blood chemistry: Secondary | ICD-10-CM | POA: Insufficient documentation

## 2019-05-09 DIAGNOSIS — L89620 Pressure ulcer of left heel, unstageable: Secondary | ICD-10-CM | POA: Diagnosis not present

## 2019-05-09 DIAGNOSIS — R6889 Other general symptoms and signs: Secondary | ICD-10-CM | POA: Insufficient documentation

## 2019-05-09 LAB — CBC
HCT: 39.3 % (ref 39.0–52.0)
Hemoglobin: 12.6 g/dL — ABNORMAL LOW (ref 13.0–17.0)
MCH: 31.1 pg (ref 26.0–34.0)
MCHC: 32.1 g/dL (ref 30.0–36.0)
MCV: 97 fL (ref 80.0–100.0)
Platelets: 201 10*3/uL (ref 150–400)
RBC: 4.05 MIL/uL — ABNORMAL LOW (ref 4.22–5.81)
RDW: 18 % — ABNORMAL HIGH (ref 11.5–15.5)
WBC: 9.5 10*3/uL (ref 4.0–10.5)
nRBC: 0.2 % (ref 0.0–0.2)

## 2019-05-09 LAB — BASIC METABOLIC PANEL
Anion gap: 9 (ref 5–15)
BUN: 32 mg/dL — ABNORMAL HIGH (ref 8–23)
CO2: 27 mmol/L (ref 22–32)
Calcium: 9 mg/dL (ref 8.9–10.3)
Chloride: 100 mmol/L (ref 98–111)
Creatinine, Ser: 0.85 mg/dL (ref 0.61–1.24)
GFR calc Af Amer: >60 mL/min (ref >60)
GFR calc non Af Amer: >60 mL/min (ref >60)
Glucose, Bld: 61 mg/dL — ABNORMAL LOW (ref 70–99)
Potassium: 4.7 mmol/L (ref 3.5–5.1)
Sodium: 136 mmol/L (ref 135–145)

## 2019-05-11 DIAGNOSIS — E669 Obesity, unspecified: Secondary | ICD-10-CM | POA: Diagnosis not present

## 2019-05-11 DIAGNOSIS — L89620 Pressure ulcer of left heel, unstageable: Secondary | ICD-10-CM | POA: Diagnosis not present

## 2019-05-11 DIAGNOSIS — J449 Chronic obstructive pulmonary disease, unspecified: Secondary | ICD-10-CM | POA: Diagnosis not present

## 2019-05-11 DIAGNOSIS — R600 Localized edema: Secondary | ICD-10-CM | POA: Diagnosis not present

## 2019-05-11 DIAGNOSIS — I10 Essential (primary) hypertension: Secondary | ICD-10-CM | POA: Diagnosis not present

## 2019-05-11 DIAGNOSIS — M109 Gout, unspecified: Secondary | ICD-10-CM | POA: Diagnosis not present

## 2019-05-11 DIAGNOSIS — Z6836 Body mass index (BMI) 36.0-36.9, adult: Secondary | ICD-10-CM | POA: Diagnosis not present

## 2019-05-11 DIAGNOSIS — L989 Disorder of the skin and subcutaneous tissue, unspecified: Secondary | ICD-10-CM | POA: Diagnosis not present

## 2019-05-11 DIAGNOSIS — F17219 Nicotine dependence, cigarettes, with unspecified nicotine-induced disorders: Secondary | ICD-10-CM | POA: Diagnosis not present

## 2019-05-14 DIAGNOSIS — F17219 Nicotine dependence, cigarettes, with unspecified nicotine-induced disorders: Secondary | ICD-10-CM | POA: Diagnosis not present

## 2019-05-14 DIAGNOSIS — L989 Disorder of the skin and subcutaneous tissue, unspecified: Secondary | ICD-10-CM | POA: Diagnosis not present

## 2019-05-14 DIAGNOSIS — L89620 Pressure ulcer of left heel, unstageable: Secondary | ICD-10-CM | POA: Diagnosis not present

## 2019-05-14 DIAGNOSIS — Z6836 Body mass index (BMI) 36.0-36.9, adult: Secondary | ICD-10-CM | POA: Diagnosis not present

## 2019-05-14 DIAGNOSIS — J449 Chronic obstructive pulmonary disease, unspecified: Secondary | ICD-10-CM | POA: Diagnosis not present

## 2019-05-14 DIAGNOSIS — I10 Essential (primary) hypertension: Secondary | ICD-10-CM | POA: Diagnosis not present

## 2019-05-14 DIAGNOSIS — R600 Localized edema: Secondary | ICD-10-CM | POA: Diagnosis not present

## 2019-05-14 DIAGNOSIS — E669 Obesity, unspecified: Secondary | ICD-10-CM | POA: Diagnosis not present

## 2019-05-14 DIAGNOSIS — M109 Gout, unspecified: Secondary | ICD-10-CM | POA: Diagnosis not present

## 2019-05-15 DIAGNOSIS — F17219 Nicotine dependence, cigarettes, with unspecified nicotine-induced disorders: Secondary | ICD-10-CM | POA: Diagnosis not present

## 2019-05-15 DIAGNOSIS — L989 Disorder of the skin and subcutaneous tissue, unspecified: Secondary | ICD-10-CM | POA: Diagnosis not present

## 2019-05-15 DIAGNOSIS — M109 Gout, unspecified: Secondary | ICD-10-CM | POA: Diagnosis not present

## 2019-05-15 DIAGNOSIS — I10 Essential (primary) hypertension: Secondary | ICD-10-CM | POA: Diagnosis not present

## 2019-05-15 DIAGNOSIS — E669 Obesity, unspecified: Secondary | ICD-10-CM | POA: Diagnosis not present

## 2019-05-15 DIAGNOSIS — Z6836 Body mass index (BMI) 36.0-36.9, adult: Secondary | ICD-10-CM | POA: Diagnosis not present

## 2019-05-15 DIAGNOSIS — J449 Chronic obstructive pulmonary disease, unspecified: Secondary | ICD-10-CM | POA: Diagnosis not present

## 2019-05-15 DIAGNOSIS — L89620 Pressure ulcer of left heel, unstageable: Secondary | ICD-10-CM | POA: Diagnosis not present

## 2019-05-15 DIAGNOSIS — R600 Localized edema: Secondary | ICD-10-CM | POA: Diagnosis not present

## 2019-05-18 DIAGNOSIS — Z6836 Body mass index (BMI) 36.0-36.9, adult: Secondary | ICD-10-CM | POA: Diagnosis not present

## 2019-05-18 DIAGNOSIS — M6281 Muscle weakness (generalized): Secondary | ICD-10-CM | POA: Diagnosis not present

## 2019-05-18 DIAGNOSIS — E669 Obesity, unspecified: Secondary | ICD-10-CM | POA: Diagnosis not present

## 2019-05-18 DIAGNOSIS — I10 Essential (primary) hypertension: Secondary | ICD-10-CM | POA: Diagnosis not present

## 2019-05-18 DIAGNOSIS — L989 Disorder of the skin and subcutaneous tissue, unspecified: Secondary | ICD-10-CM | POA: Diagnosis not present

## 2019-05-18 DIAGNOSIS — M4322 Fusion of spine, cervical region: Secondary | ICD-10-CM | POA: Diagnosis not present

## 2019-05-18 DIAGNOSIS — R600 Localized edema: Secondary | ICD-10-CM | POA: Diagnosis not present

## 2019-05-18 DIAGNOSIS — M109 Gout, unspecified: Secondary | ICD-10-CM | POA: Diagnosis not present

## 2019-05-18 DIAGNOSIS — F17219 Nicotine dependence, cigarettes, with unspecified nicotine-induced disorders: Secondary | ICD-10-CM | POA: Diagnosis not present

## 2019-05-18 DIAGNOSIS — L89620 Pressure ulcer of left heel, unstageable: Secondary | ICD-10-CM | POA: Diagnosis not present

## 2019-05-18 DIAGNOSIS — J449 Chronic obstructive pulmonary disease, unspecified: Secondary | ICD-10-CM | POA: Diagnosis not present

## 2019-05-22 DIAGNOSIS — J449 Chronic obstructive pulmonary disease, unspecified: Secondary | ICD-10-CM | POA: Diagnosis not present

## 2019-05-22 DIAGNOSIS — R531 Weakness: Secondary | ICD-10-CM | POA: Diagnosis not present

## 2019-05-22 DIAGNOSIS — L989 Disorder of the skin and subcutaneous tissue, unspecified: Secondary | ICD-10-CM | POA: Diagnosis not present

## 2019-05-22 DIAGNOSIS — Z743 Need for continuous supervision: Secondary | ICD-10-CM | POA: Diagnosis not present

## 2019-05-22 DIAGNOSIS — Z6836 Body mass index (BMI) 36.0-36.9, adult: Secondary | ICD-10-CM | POA: Diagnosis not present

## 2019-05-22 DIAGNOSIS — R600 Localized edema: Secondary | ICD-10-CM | POA: Diagnosis not present

## 2019-05-22 DIAGNOSIS — R52 Pain, unspecified: Secondary | ICD-10-CM | POA: Diagnosis not present

## 2019-05-22 DIAGNOSIS — I10 Essential (primary) hypertension: Secondary | ICD-10-CM | POA: Diagnosis not present

## 2019-05-22 DIAGNOSIS — L89620 Pressure ulcer of left heel, unstageable: Secondary | ICD-10-CM | POA: Diagnosis not present

## 2019-05-22 DIAGNOSIS — E669 Obesity, unspecified: Secondary | ICD-10-CM | POA: Diagnosis not present

## 2019-05-22 DIAGNOSIS — R0902 Hypoxemia: Secondary | ICD-10-CM | POA: Diagnosis not present

## 2019-05-22 DIAGNOSIS — F17219 Nicotine dependence, cigarettes, with unspecified nicotine-induced disorders: Secondary | ICD-10-CM | POA: Diagnosis not present

## 2019-05-22 DIAGNOSIS — M109 Gout, unspecified: Secondary | ICD-10-CM | POA: Diagnosis not present

## 2019-05-22 DIAGNOSIS — M4800 Spinal stenosis, site unspecified: Secondary | ICD-10-CM | POA: Diagnosis not present

## 2019-05-23 DIAGNOSIS — J449 Chronic obstructive pulmonary disease, unspecified: Secondary | ICD-10-CM | POA: Diagnosis not present

## 2019-05-23 DIAGNOSIS — L03116 Cellulitis of left lower limb: Secondary | ICD-10-CM | POA: Diagnosis not present

## 2019-05-23 DIAGNOSIS — M6281 Muscle weakness (generalized): Secondary | ICD-10-CM | POA: Diagnosis not present

## 2019-05-23 DIAGNOSIS — M4322 Fusion of spine, cervical region: Secondary | ICD-10-CM | POA: Diagnosis not present

## 2019-05-23 DIAGNOSIS — L03115 Cellulitis of right lower limb: Secondary | ICD-10-CM | POA: Diagnosis not present

## 2019-05-24 DIAGNOSIS — M6249 Contracture of muscle, multiple sites: Secondary | ICD-10-CM | POA: Diagnosis not present

## 2019-05-25 DIAGNOSIS — F17219 Nicotine dependence, cigarettes, with unspecified nicotine-induced disorders: Secondary | ICD-10-CM | POA: Diagnosis not present

## 2019-05-25 DIAGNOSIS — J449 Chronic obstructive pulmonary disease, unspecified: Secondary | ICD-10-CM | POA: Diagnosis not present

## 2019-05-25 DIAGNOSIS — Z6836 Body mass index (BMI) 36.0-36.9, adult: Secondary | ICD-10-CM | POA: Diagnosis not present

## 2019-05-25 DIAGNOSIS — E669 Obesity, unspecified: Secondary | ICD-10-CM | POA: Diagnosis not present

## 2019-05-25 DIAGNOSIS — L989 Disorder of the skin and subcutaneous tissue, unspecified: Secondary | ICD-10-CM | POA: Diagnosis not present

## 2019-05-25 DIAGNOSIS — M109 Gout, unspecified: Secondary | ICD-10-CM | POA: Diagnosis not present

## 2019-05-25 DIAGNOSIS — L89620 Pressure ulcer of left heel, unstageable: Secondary | ICD-10-CM | POA: Diagnosis not present

## 2019-05-25 DIAGNOSIS — R600 Localized edema: Secondary | ICD-10-CM | POA: Diagnosis not present

## 2019-05-25 DIAGNOSIS — I10 Essential (primary) hypertension: Secondary | ICD-10-CM | POA: Diagnosis not present

## 2019-05-28 DIAGNOSIS — E669 Obesity, unspecified: Secondary | ICD-10-CM | POA: Diagnosis not present

## 2019-05-28 DIAGNOSIS — Z6836 Body mass index (BMI) 36.0-36.9, adult: Secondary | ICD-10-CM | POA: Diagnosis not present

## 2019-05-28 DIAGNOSIS — R600 Localized edema: Secondary | ICD-10-CM | POA: Diagnosis not present

## 2019-05-28 DIAGNOSIS — I10 Essential (primary) hypertension: Secondary | ICD-10-CM | POA: Diagnosis not present

## 2019-05-28 DIAGNOSIS — J449 Chronic obstructive pulmonary disease, unspecified: Secondary | ICD-10-CM | POA: Diagnosis not present

## 2019-05-28 DIAGNOSIS — L989 Disorder of the skin and subcutaneous tissue, unspecified: Secondary | ICD-10-CM | POA: Diagnosis not present

## 2019-05-28 DIAGNOSIS — F17219 Nicotine dependence, cigarettes, with unspecified nicotine-induced disorders: Secondary | ICD-10-CM | POA: Diagnosis not present

## 2019-05-28 DIAGNOSIS — L89620 Pressure ulcer of left heel, unstageable: Secondary | ICD-10-CM | POA: Diagnosis not present

## 2019-05-28 DIAGNOSIS — M109 Gout, unspecified: Secondary | ICD-10-CM | POA: Diagnosis not present

## 2019-05-31 DIAGNOSIS — M109 Gout, unspecified: Secondary | ICD-10-CM | POA: Diagnosis not present

## 2019-05-31 DIAGNOSIS — I10 Essential (primary) hypertension: Secondary | ICD-10-CM | POA: Diagnosis not present

## 2019-05-31 DIAGNOSIS — L89620 Pressure ulcer of left heel, unstageable: Secondary | ICD-10-CM | POA: Diagnosis not present

## 2019-05-31 DIAGNOSIS — L989 Disorder of the skin and subcutaneous tissue, unspecified: Secondary | ICD-10-CM | POA: Diagnosis not present

## 2019-05-31 DIAGNOSIS — J449 Chronic obstructive pulmonary disease, unspecified: Secondary | ICD-10-CM | POA: Diagnosis not present

## 2019-05-31 DIAGNOSIS — F17219 Nicotine dependence, cigarettes, with unspecified nicotine-induced disorders: Secondary | ICD-10-CM | POA: Diagnosis not present

## 2019-05-31 DIAGNOSIS — R600 Localized edema: Secondary | ICD-10-CM | POA: Diagnosis not present

## 2019-05-31 DIAGNOSIS — E669 Obesity, unspecified: Secondary | ICD-10-CM | POA: Diagnosis not present

## 2019-05-31 DIAGNOSIS — Z6836 Body mass index (BMI) 36.0-36.9, adult: Secondary | ICD-10-CM | POA: Diagnosis not present

## 2019-06-04 DIAGNOSIS — J449 Chronic obstructive pulmonary disease, unspecified: Secondary | ICD-10-CM | POA: Diagnosis not present

## 2019-06-04 DIAGNOSIS — E669 Obesity, unspecified: Secondary | ICD-10-CM | POA: Diagnosis not present

## 2019-06-04 DIAGNOSIS — R600 Localized edema: Secondary | ICD-10-CM | POA: Diagnosis not present

## 2019-06-04 DIAGNOSIS — L89620 Pressure ulcer of left heel, unstageable: Secondary | ICD-10-CM | POA: Diagnosis not present

## 2019-06-04 DIAGNOSIS — Z6836 Body mass index (BMI) 36.0-36.9, adult: Secondary | ICD-10-CM | POA: Diagnosis not present

## 2019-06-04 DIAGNOSIS — F17219 Nicotine dependence, cigarettes, with unspecified nicotine-induced disorders: Secondary | ICD-10-CM | POA: Diagnosis not present

## 2019-06-04 DIAGNOSIS — M109 Gout, unspecified: Secondary | ICD-10-CM | POA: Diagnosis not present

## 2019-06-04 DIAGNOSIS — I10 Essential (primary) hypertension: Secondary | ICD-10-CM | POA: Diagnosis not present

## 2019-06-04 DIAGNOSIS — L989 Disorder of the skin and subcutaneous tissue, unspecified: Secondary | ICD-10-CM | POA: Diagnosis not present

## 2019-06-05 DIAGNOSIS — E669 Obesity, unspecified: Secondary | ICD-10-CM | POA: Diagnosis not present

## 2019-06-05 DIAGNOSIS — M109 Gout, unspecified: Secondary | ICD-10-CM | POA: Diagnosis not present

## 2019-06-05 DIAGNOSIS — J449 Chronic obstructive pulmonary disease, unspecified: Secondary | ICD-10-CM | POA: Diagnosis not present

## 2019-06-05 DIAGNOSIS — R600 Localized edema: Secondary | ICD-10-CM | POA: Diagnosis not present

## 2019-06-05 DIAGNOSIS — L989 Disorder of the skin and subcutaneous tissue, unspecified: Secondary | ICD-10-CM | POA: Diagnosis not present

## 2019-06-05 DIAGNOSIS — Z6836 Body mass index (BMI) 36.0-36.9, adult: Secondary | ICD-10-CM | POA: Diagnosis not present

## 2019-06-05 DIAGNOSIS — I10 Essential (primary) hypertension: Secondary | ICD-10-CM | POA: Diagnosis not present

## 2019-06-05 DIAGNOSIS — F17219 Nicotine dependence, cigarettes, with unspecified nicotine-induced disorders: Secondary | ICD-10-CM | POA: Diagnosis not present

## 2019-06-05 DIAGNOSIS — L89620 Pressure ulcer of left heel, unstageable: Secondary | ICD-10-CM | POA: Diagnosis not present

## 2019-06-07 DIAGNOSIS — J449 Chronic obstructive pulmonary disease, unspecified: Secondary | ICD-10-CM | POA: Diagnosis not present

## 2019-06-07 DIAGNOSIS — E669 Obesity, unspecified: Secondary | ICD-10-CM | POA: Diagnosis not present

## 2019-06-07 DIAGNOSIS — L89620 Pressure ulcer of left heel, unstageable: Secondary | ICD-10-CM | POA: Diagnosis not present

## 2019-06-07 DIAGNOSIS — M109 Gout, unspecified: Secondary | ICD-10-CM | POA: Diagnosis not present

## 2019-06-07 DIAGNOSIS — L989 Disorder of the skin and subcutaneous tissue, unspecified: Secondary | ICD-10-CM | POA: Diagnosis not present

## 2019-06-07 DIAGNOSIS — R600 Localized edema: Secondary | ICD-10-CM | POA: Diagnosis not present

## 2019-06-07 DIAGNOSIS — Z6836 Body mass index (BMI) 36.0-36.9, adult: Secondary | ICD-10-CM | POA: Diagnosis not present

## 2019-06-07 DIAGNOSIS — I10 Essential (primary) hypertension: Secondary | ICD-10-CM | POA: Diagnosis not present

## 2019-06-07 DIAGNOSIS — F17219 Nicotine dependence, cigarettes, with unspecified nicotine-induced disorders: Secondary | ICD-10-CM | POA: Diagnosis not present

## 2019-06-12 DIAGNOSIS — Z6836 Body mass index (BMI) 36.0-36.9, adult: Secondary | ICD-10-CM | POA: Diagnosis not present

## 2019-06-12 DIAGNOSIS — M109 Gout, unspecified: Secondary | ICD-10-CM | POA: Diagnosis not present

## 2019-06-12 DIAGNOSIS — J449 Chronic obstructive pulmonary disease, unspecified: Secondary | ICD-10-CM | POA: Diagnosis not present

## 2019-06-12 DIAGNOSIS — F17219 Nicotine dependence, cigarettes, with unspecified nicotine-induced disorders: Secondary | ICD-10-CM | POA: Diagnosis not present

## 2019-06-12 DIAGNOSIS — E669 Obesity, unspecified: Secondary | ICD-10-CM | POA: Diagnosis not present

## 2019-06-12 DIAGNOSIS — R600 Localized edema: Secondary | ICD-10-CM | POA: Diagnosis not present

## 2019-06-12 DIAGNOSIS — I10 Essential (primary) hypertension: Secondary | ICD-10-CM | POA: Diagnosis not present

## 2019-06-12 DIAGNOSIS — L89620 Pressure ulcer of left heel, unstageable: Secondary | ICD-10-CM | POA: Diagnosis not present

## 2019-06-12 DIAGNOSIS — L989 Disorder of the skin and subcutaneous tissue, unspecified: Secondary | ICD-10-CM | POA: Diagnosis not present

## 2019-06-13 DIAGNOSIS — Z6836 Body mass index (BMI) 36.0-36.9, adult: Secondary | ICD-10-CM | POA: Diagnosis not present

## 2019-06-13 DIAGNOSIS — M109 Gout, unspecified: Secondary | ICD-10-CM | POA: Diagnosis not present

## 2019-06-13 DIAGNOSIS — F17219 Nicotine dependence, cigarettes, with unspecified nicotine-induced disorders: Secondary | ICD-10-CM | POA: Diagnosis not present

## 2019-06-13 DIAGNOSIS — E669 Obesity, unspecified: Secondary | ICD-10-CM | POA: Diagnosis not present

## 2019-06-13 DIAGNOSIS — J449 Chronic obstructive pulmonary disease, unspecified: Secondary | ICD-10-CM | POA: Diagnosis not present

## 2019-06-13 DIAGNOSIS — L989 Disorder of the skin and subcutaneous tissue, unspecified: Secondary | ICD-10-CM | POA: Diagnosis not present

## 2019-06-13 DIAGNOSIS — R600 Localized edema: Secondary | ICD-10-CM | POA: Diagnosis not present

## 2019-06-13 DIAGNOSIS — L89620 Pressure ulcer of left heel, unstageable: Secondary | ICD-10-CM | POA: Diagnosis not present

## 2019-06-13 DIAGNOSIS — I10 Essential (primary) hypertension: Secondary | ICD-10-CM | POA: Diagnosis not present

## 2019-06-17 DIAGNOSIS — M4322 Fusion of spine, cervical region: Secondary | ICD-10-CM | POA: Diagnosis not present

## 2019-06-17 DIAGNOSIS — M6281 Muscle weakness (generalized): Secondary | ICD-10-CM | POA: Diagnosis not present

## 2019-06-17 DIAGNOSIS — J449 Chronic obstructive pulmonary disease, unspecified: Secondary | ICD-10-CM | POA: Diagnosis not present

## 2019-06-19 DIAGNOSIS — E871 Hypo-osmolality and hyponatremia: Secondary | ICD-10-CM | POA: Diagnosis not present

## 2019-06-19 DIAGNOSIS — L89891 Pressure ulcer of other site, stage 1: Secondary | ICD-10-CM | POA: Diagnosis not present

## 2019-06-19 DIAGNOSIS — L03115 Cellulitis of right lower limb: Secondary | ICD-10-CM | POA: Diagnosis not present

## 2019-06-19 DIAGNOSIS — I482 Chronic atrial fibrillation, unspecified: Secondary | ICD-10-CM | POA: Diagnosis not present

## 2019-06-19 DIAGNOSIS — Z209 Contact with and (suspected) exposure to unspecified communicable disease: Secondary | ICD-10-CM | POA: Diagnosis not present

## 2019-06-19 DIAGNOSIS — I872 Venous insufficiency (chronic) (peripheral): Secondary | ICD-10-CM | POA: Diagnosis not present

## 2019-06-19 DIAGNOSIS — L89612 Pressure ulcer of right heel, stage 2: Secondary | ICD-10-CM | POA: Diagnosis not present

## 2019-06-19 DIAGNOSIS — L89629 Pressure ulcer of left heel, unspecified stage: Secondary | ICD-10-CM | POA: Diagnosis not present

## 2019-06-19 DIAGNOSIS — R600 Localized edema: Secondary | ICD-10-CM | POA: Diagnosis not present

## 2019-06-19 DIAGNOSIS — J90 Pleural effusion, not elsewhere classified: Secondary | ICD-10-CM | POA: Diagnosis not present

## 2019-06-19 DIAGNOSIS — Z7401 Bed confinement status: Secondary | ICD-10-CM | POA: Diagnosis not present

## 2019-06-19 DIAGNOSIS — J449 Chronic obstructive pulmonary disease, unspecified: Secondary | ICD-10-CM | POA: Diagnosis not present

## 2019-06-19 DIAGNOSIS — R52 Pain, unspecified: Secondary | ICD-10-CM | POA: Diagnosis not present

## 2019-06-19 DIAGNOSIS — R5381 Other malaise: Secondary | ICD-10-CM | POA: Diagnosis not present

## 2019-06-19 DIAGNOSIS — M6281 Muscle weakness (generalized): Secondary | ICD-10-CM | POA: Diagnosis not present

## 2019-06-19 DIAGNOSIS — L89619 Pressure ulcer of right heel, unspecified stage: Secondary | ICD-10-CM | POA: Diagnosis not present

## 2019-06-19 DIAGNOSIS — R319 Hematuria, unspecified: Secondary | ICD-10-CM | POA: Diagnosis not present

## 2019-06-19 DIAGNOSIS — N1330 Unspecified hydronephrosis: Secondary | ICD-10-CM | POA: Diagnosis not present

## 2019-06-19 DIAGNOSIS — I5042 Chronic combined systolic (congestive) and diastolic (congestive) heart failure: Secondary | ICD-10-CM | POA: Diagnosis not present

## 2019-06-19 DIAGNOSIS — L03116 Cellulitis of left lower limb: Secondary | ICD-10-CM | POA: Diagnosis not present

## 2019-06-19 DIAGNOSIS — I714 Abdominal aortic aneurysm, without rupture: Secondary | ICD-10-CM | POA: Diagnosis not present

## 2019-06-19 DIAGNOSIS — I959 Hypotension, unspecified: Secondary | ICD-10-CM | POA: Diagnosis not present

## 2019-06-19 DIAGNOSIS — L89622 Pressure ulcer of left heel, stage 2: Secondary | ICD-10-CM | POA: Diagnosis not present

## 2019-06-19 DIAGNOSIS — M4322 Fusion of spine, cervical region: Secondary | ICD-10-CM | POA: Diagnosis not present

## 2019-06-19 DIAGNOSIS — I110 Hypertensive heart disease with heart failure: Secondary | ICD-10-CM | POA: Diagnosis not present

## 2019-06-19 DIAGNOSIS — M6249 Contracture of muscle, multiple sites: Secondary | ICD-10-CM | POA: Diagnosis not present

## 2019-06-19 DIAGNOSIS — R609 Edema, unspecified: Secondary | ICD-10-CM | POA: Diagnosis not present

## 2019-07-03 DIAGNOSIS — J449 Chronic obstructive pulmonary disease, unspecified: Secondary | ICD-10-CM | POA: Diagnosis not present

## 2019-07-03 DIAGNOSIS — L89620 Pressure ulcer of left heel, unstageable: Secondary | ICD-10-CM | POA: Diagnosis not present

## 2019-07-03 DIAGNOSIS — R600 Localized edema: Secondary | ICD-10-CM | POA: Diagnosis not present

## 2019-07-03 DIAGNOSIS — L989 Disorder of the skin and subcutaneous tissue, unspecified: Secondary | ICD-10-CM | POA: Diagnosis not present

## 2019-07-03 DIAGNOSIS — M109 Gout, unspecified: Secondary | ICD-10-CM | POA: Diagnosis not present

## 2019-07-03 DIAGNOSIS — Z6836 Body mass index (BMI) 36.0-36.9, adult: Secondary | ICD-10-CM | POA: Diagnosis not present

## 2019-07-03 DIAGNOSIS — F17219 Nicotine dependence, cigarettes, with unspecified nicotine-induced disorders: Secondary | ICD-10-CM | POA: Diagnosis not present

## 2019-07-03 DIAGNOSIS — I10 Essential (primary) hypertension: Secondary | ICD-10-CM | POA: Diagnosis not present

## 2019-07-03 DIAGNOSIS — E669 Obesity, unspecified: Secondary | ICD-10-CM | POA: Diagnosis not present

## 2019-07-06 DIAGNOSIS — J449 Chronic obstructive pulmonary disease, unspecified: Secondary | ICD-10-CM | POA: Diagnosis not present

## 2019-07-06 DIAGNOSIS — M109 Gout, unspecified: Secondary | ICD-10-CM | POA: Diagnosis not present

## 2019-07-06 DIAGNOSIS — F17219 Nicotine dependence, cigarettes, with unspecified nicotine-induced disorders: Secondary | ICD-10-CM | POA: Diagnosis not present

## 2019-07-06 DIAGNOSIS — I10 Essential (primary) hypertension: Secondary | ICD-10-CM | POA: Diagnosis not present

## 2019-07-06 DIAGNOSIS — R600 Localized edema: Secondary | ICD-10-CM | POA: Diagnosis not present

## 2019-07-06 DIAGNOSIS — L989 Disorder of the skin and subcutaneous tissue, unspecified: Secondary | ICD-10-CM | POA: Diagnosis not present

## 2019-07-06 DIAGNOSIS — L89620 Pressure ulcer of left heel, unstageable: Secondary | ICD-10-CM | POA: Diagnosis not present

## 2019-07-06 DIAGNOSIS — Z6836 Body mass index (BMI) 36.0-36.9, adult: Secondary | ICD-10-CM | POA: Diagnosis not present

## 2019-07-06 DIAGNOSIS — E669 Obesity, unspecified: Secondary | ICD-10-CM | POA: Diagnosis not present

## 2019-07-08 DIAGNOSIS — L89892 Pressure ulcer of other site, stage 2: Secondary | ICD-10-CM | POA: Diagnosis not present

## 2019-07-08 DIAGNOSIS — Z4800 Encounter for change or removal of nonsurgical wound dressing: Secondary | ICD-10-CM | POA: Diagnosis not present

## 2019-07-08 DIAGNOSIS — L989 Disorder of the skin and subcutaneous tissue, unspecified: Secondary | ICD-10-CM | POA: Diagnosis not present

## 2019-07-08 DIAGNOSIS — L89610 Pressure ulcer of right heel, unstageable: Secondary | ICD-10-CM | POA: Diagnosis not present

## 2019-07-08 DIAGNOSIS — L89620 Pressure ulcer of left heel, unstageable: Secondary | ICD-10-CM | POA: Diagnosis not present

## 2019-07-09 DIAGNOSIS — Z7401 Bed confinement status: Secondary | ICD-10-CM | POA: Diagnosis not present

## 2019-07-09 DIAGNOSIS — Z125 Encounter for screening for malignant neoplasm of prostate: Secondary | ICD-10-CM | POA: Diagnosis not present

## 2019-07-09 DIAGNOSIS — I10 Essential (primary) hypertension: Secondary | ICD-10-CM | POA: Diagnosis not present

## 2019-07-09 DIAGNOSIS — R279 Unspecified lack of coordination: Secondary | ICD-10-CM | POA: Diagnosis not present

## 2019-07-09 DIAGNOSIS — R5381 Other malaise: Secondary | ICD-10-CM | POA: Diagnosis not present

## 2019-07-09 DIAGNOSIS — M1009 Idiopathic gout, multiple sites: Secondary | ICD-10-CM | POA: Diagnosis not present

## 2019-07-09 DIAGNOSIS — Z1159 Encounter for screening for other viral diseases: Secondary | ICD-10-CM | POA: Diagnosis not present

## 2019-07-09 DIAGNOSIS — R7303 Prediabetes: Secondary | ICD-10-CM | POA: Diagnosis not present

## 2019-07-09 DIAGNOSIS — I959 Hypotension, unspecified: Secondary | ICD-10-CM | POA: Diagnosis not present

## 2019-07-09 DIAGNOSIS — Z1389 Encounter for screening for other disorder: Secondary | ICD-10-CM | POA: Diagnosis not present

## 2019-07-09 DIAGNOSIS — L0390 Cellulitis, unspecified: Secondary | ICD-10-CM | POA: Diagnosis not present

## 2019-07-09 DIAGNOSIS — J441 Chronic obstructive pulmonary disease with (acute) exacerbation: Secondary | ICD-10-CM | POA: Diagnosis not present

## 2019-07-09 DIAGNOSIS — Z0000 Encounter for general adult medical examination without abnormal findings: Secondary | ICD-10-CM | POA: Diagnosis not present

## 2019-07-10 DIAGNOSIS — R600 Localized edema: Secondary | ICD-10-CM | POA: Diagnosis not present

## 2019-07-10 DIAGNOSIS — Z4800 Encounter for change or removal of nonsurgical wound dressing: Secondary | ICD-10-CM | POA: Diagnosis not present

## 2019-07-10 DIAGNOSIS — L89610 Pressure ulcer of right heel, unstageable: Secondary | ICD-10-CM | POA: Diagnosis not present

## 2019-07-10 DIAGNOSIS — L89620 Pressure ulcer of left heel, unstageable: Secondary | ICD-10-CM | POA: Diagnosis not present

## 2019-07-10 DIAGNOSIS — J449 Chronic obstructive pulmonary disease, unspecified: Secondary | ICD-10-CM | POA: Diagnosis not present

## 2019-07-10 DIAGNOSIS — M109 Gout, unspecified: Secondary | ICD-10-CM | POA: Diagnosis not present

## 2019-07-10 DIAGNOSIS — I10 Essential (primary) hypertension: Secondary | ICD-10-CM | POA: Diagnosis not present

## 2019-07-10 DIAGNOSIS — L989 Disorder of the skin and subcutaneous tissue, unspecified: Secondary | ICD-10-CM | POA: Diagnosis not present

## 2019-07-10 DIAGNOSIS — L89892 Pressure ulcer of other site, stage 2: Secondary | ICD-10-CM | POA: Diagnosis not present

## 2019-07-12 DIAGNOSIS — L89892 Pressure ulcer of other site, stage 2: Secondary | ICD-10-CM | POA: Diagnosis not present

## 2019-07-12 DIAGNOSIS — Z4800 Encounter for change or removal of nonsurgical wound dressing: Secondary | ICD-10-CM | POA: Diagnosis not present

## 2019-07-12 DIAGNOSIS — M109 Gout, unspecified: Secondary | ICD-10-CM | POA: Diagnosis not present

## 2019-07-12 DIAGNOSIS — I10 Essential (primary) hypertension: Secondary | ICD-10-CM | POA: Diagnosis not present

## 2019-07-12 DIAGNOSIS — L89620 Pressure ulcer of left heel, unstageable: Secondary | ICD-10-CM | POA: Diagnosis not present

## 2019-07-12 DIAGNOSIS — L89610 Pressure ulcer of right heel, unstageable: Secondary | ICD-10-CM | POA: Diagnosis not present

## 2019-07-12 DIAGNOSIS — R600 Localized edema: Secondary | ICD-10-CM | POA: Diagnosis not present

## 2019-07-12 DIAGNOSIS — L989 Disorder of the skin and subcutaneous tissue, unspecified: Secondary | ICD-10-CM | POA: Diagnosis not present

## 2019-07-12 DIAGNOSIS — J449 Chronic obstructive pulmonary disease, unspecified: Secondary | ICD-10-CM | POA: Diagnosis not present

## 2019-07-16 DIAGNOSIS — L89610 Pressure ulcer of right heel, unstageable: Secondary | ICD-10-CM | POA: Diagnosis not present

## 2019-07-16 DIAGNOSIS — Z4800 Encounter for change or removal of nonsurgical wound dressing: Secondary | ICD-10-CM | POA: Diagnosis not present

## 2019-07-16 DIAGNOSIS — L89620 Pressure ulcer of left heel, unstageable: Secondary | ICD-10-CM | POA: Diagnosis not present

## 2019-07-16 DIAGNOSIS — L89892 Pressure ulcer of other site, stage 2: Secondary | ICD-10-CM | POA: Diagnosis not present

## 2019-07-16 DIAGNOSIS — L989 Disorder of the skin and subcutaneous tissue, unspecified: Secondary | ICD-10-CM | POA: Diagnosis not present

## 2019-07-16 DIAGNOSIS — R600 Localized edema: Secondary | ICD-10-CM | POA: Diagnosis not present

## 2019-07-16 DIAGNOSIS — J449 Chronic obstructive pulmonary disease, unspecified: Secondary | ICD-10-CM | POA: Diagnosis not present

## 2019-07-16 DIAGNOSIS — I10 Essential (primary) hypertension: Secondary | ICD-10-CM | POA: Diagnosis not present

## 2019-07-16 DIAGNOSIS — M109 Gout, unspecified: Secondary | ICD-10-CM | POA: Diagnosis not present

## 2019-07-20 DIAGNOSIS — L89892 Pressure ulcer of other site, stage 2: Secondary | ICD-10-CM | POA: Diagnosis not present

## 2019-07-20 DIAGNOSIS — M109 Gout, unspecified: Secondary | ICD-10-CM | POA: Diagnosis not present

## 2019-07-20 DIAGNOSIS — R600 Localized edema: Secondary | ICD-10-CM | POA: Diagnosis not present

## 2019-07-20 DIAGNOSIS — L89610 Pressure ulcer of right heel, unstageable: Secondary | ICD-10-CM | POA: Diagnosis not present

## 2019-07-20 DIAGNOSIS — L89620 Pressure ulcer of left heel, unstageable: Secondary | ICD-10-CM | POA: Diagnosis not present

## 2019-07-20 DIAGNOSIS — Z4800 Encounter for change or removal of nonsurgical wound dressing: Secondary | ICD-10-CM | POA: Diagnosis not present

## 2019-07-20 DIAGNOSIS — J449 Chronic obstructive pulmonary disease, unspecified: Secondary | ICD-10-CM | POA: Diagnosis not present

## 2019-07-20 DIAGNOSIS — L989 Disorder of the skin and subcutaneous tissue, unspecified: Secondary | ICD-10-CM | POA: Diagnosis not present

## 2019-07-20 DIAGNOSIS — I10 Essential (primary) hypertension: Secondary | ICD-10-CM | POA: Diagnosis not present

## 2019-07-23 DIAGNOSIS — L03116 Cellulitis of left lower limb: Secondary | ICD-10-CM | POA: Diagnosis not present

## 2019-07-23 DIAGNOSIS — L03115 Cellulitis of right lower limb: Secondary | ICD-10-CM | POA: Diagnosis not present

## 2019-07-23 DIAGNOSIS — J449 Chronic obstructive pulmonary disease, unspecified: Secondary | ICD-10-CM | POA: Diagnosis not present

## 2019-07-23 DIAGNOSIS — M6281 Muscle weakness (generalized): Secondary | ICD-10-CM | POA: Diagnosis not present

## 2019-07-23 DIAGNOSIS — M4322 Fusion of spine, cervical region: Secondary | ICD-10-CM | POA: Diagnosis not present

## 2019-07-24 DIAGNOSIS — L89610 Pressure ulcer of right heel, unstageable: Secondary | ICD-10-CM | POA: Diagnosis not present

## 2019-07-24 DIAGNOSIS — M6249 Contracture of muscle, multiple sites: Secondary | ICD-10-CM | POA: Diagnosis not present

## 2019-07-24 DIAGNOSIS — L989 Disorder of the skin and subcutaneous tissue, unspecified: Secondary | ICD-10-CM | POA: Diagnosis not present

## 2019-07-24 DIAGNOSIS — J449 Chronic obstructive pulmonary disease, unspecified: Secondary | ICD-10-CM | POA: Diagnosis not present

## 2019-07-24 DIAGNOSIS — L89620 Pressure ulcer of left heel, unstageable: Secondary | ICD-10-CM | POA: Diagnosis not present

## 2019-07-24 DIAGNOSIS — I10 Essential (primary) hypertension: Secondary | ICD-10-CM | POA: Diagnosis not present

## 2019-07-24 DIAGNOSIS — Z4800 Encounter for change or removal of nonsurgical wound dressing: Secondary | ICD-10-CM | POA: Diagnosis not present

## 2019-07-24 DIAGNOSIS — L89892 Pressure ulcer of other site, stage 2: Secondary | ICD-10-CM | POA: Diagnosis not present

## 2019-07-24 DIAGNOSIS — R600 Localized edema: Secondary | ICD-10-CM | POA: Diagnosis not present

## 2019-07-24 DIAGNOSIS — M109 Gout, unspecified: Secondary | ICD-10-CM | POA: Diagnosis not present

## 2019-07-30 DIAGNOSIS — J449 Chronic obstructive pulmonary disease, unspecified: Secondary | ICD-10-CM | POA: Diagnosis not present

## 2019-07-30 DIAGNOSIS — L89620 Pressure ulcer of left heel, unstageable: Secondary | ICD-10-CM | POA: Diagnosis not present

## 2019-07-30 DIAGNOSIS — R600 Localized edema: Secondary | ICD-10-CM | POA: Diagnosis not present

## 2019-07-30 DIAGNOSIS — L989 Disorder of the skin and subcutaneous tissue, unspecified: Secondary | ICD-10-CM | POA: Diagnosis not present

## 2019-07-30 DIAGNOSIS — M109 Gout, unspecified: Secondary | ICD-10-CM | POA: Diagnosis not present

## 2019-07-30 DIAGNOSIS — Z4800 Encounter for change or removal of nonsurgical wound dressing: Secondary | ICD-10-CM | POA: Diagnosis not present

## 2019-07-30 DIAGNOSIS — L89892 Pressure ulcer of other site, stage 2: Secondary | ICD-10-CM | POA: Diagnosis not present

## 2019-07-30 DIAGNOSIS — I10 Essential (primary) hypertension: Secondary | ICD-10-CM | POA: Diagnosis not present

## 2019-07-30 DIAGNOSIS — L89610 Pressure ulcer of right heel, unstageable: Secondary | ICD-10-CM | POA: Diagnosis not present

## 2019-08-03 DIAGNOSIS — Z4800 Encounter for change or removal of nonsurgical wound dressing: Secondary | ICD-10-CM | POA: Diagnosis not present

## 2019-08-03 DIAGNOSIS — L89610 Pressure ulcer of right heel, unstageable: Secondary | ICD-10-CM | POA: Diagnosis not present

## 2019-08-03 DIAGNOSIS — R600 Localized edema: Secondary | ICD-10-CM | POA: Diagnosis not present

## 2019-08-03 DIAGNOSIS — L89892 Pressure ulcer of other site, stage 2: Secondary | ICD-10-CM | POA: Diagnosis not present

## 2019-08-03 DIAGNOSIS — I10 Essential (primary) hypertension: Secondary | ICD-10-CM | POA: Diagnosis not present

## 2019-08-03 DIAGNOSIS — M109 Gout, unspecified: Secondary | ICD-10-CM | POA: Diagnosis not present

## 2019-08-03 DIAGNOSIS — L89620 Pressure ulcer of left heel, unstageable: Secondary | ICD-10-CM | POA: Diagnosis not present

## 2019-08-03 DIAGNOSIS — L989 Disorder of the skin and subcutaneous tissue, unspecified: Secondary | ICD-10-CM | POA: Diagnosis not present

## 2019-08-03 DIAGNOSIS — J449 Chronic obstructive pulmonary disease, unspecified: Secondary | ICD-10-CM | POA: Diagnosis not present

## 2019-08-07 DIAGNOSIS — L89620 Pressure ulcer of left heel, unstageable: Secondary | ICD-10-CM | POA: Diagnosis not present

## 2019-08-07 DIAGNOSIS — I10 Essential (primary) hypertension: Secondary | ICD-10-CM | POA: Diagnosis not present

## 2019-08-07 DIAGNOSIS — R600 Localized edema: Secondary | ICD-10-CM | POA: Diagnosis not present

## 2019-08-07 DIAGNOSIS — L89892 Pressure ulcer of other site, stage 2: Secondary | ICD-10-CM | POA: Diagnosis not present

## 2019-08-07 DIAGNOSIS — Z4800 Encounter for change or removal of nonsurgical wound dressing: Secondary | ICD-10-CM | POA: Diagnosis not present

## 2019-08-07 DIAGNOSIS — J449 Chronic obstructive pulmonary disease, unspecified: Secondary | ICD-10-CM | POA: Diagnosis not present

## 2019-08-07 DIAGNOSIS — M109 Gout, unspecified: Secondary | ICD-10-CM | POA: Diagnosis not present

## 2019-08-07 DIAGNOSIS — L989 Disorder of the skin and subcutaneous tissue, unspecified: Secondary | ICD-10-CM | POA: Diagnosis not present

## 2019-08-07 DIAGNOSIS — L89610 Pressure ulcer of right heel, unstageable: Secondary | ICD-10-CM | POA: Diagnosis not present

## 2019-08-09 DIAGNOSIS — L89610 Pressure ulcer of right heel, unstageable: Secondary | ICD-10-CM | POA: Diagnosis not present

## 2019-08-09 DIAGNOSIS — R600 Localized edema: Secondary | ICD-10-CM | POA: Diagnosis not present

## 2019-08-09 DIAGNOSIS — M109 Gout, unspecified: Secondary | ICD-10-CM | POA: Diagnosis not present

## 2019-08-09 DIAGNOSIS — L89892 Pressure ulcer of other site, stage 2: Secondary | ICD-10-CM | POA: Diagnosis not present

## 2019-08-09 DIAGNOSIS — L989 Disorder of the skin and subcutaneous tissue, unspecified: Secondary | ICD-10-CM | POA: Diagnosis not present

## 2019-08-09 DIAGNOSIS — Z4800 Encounter for change or removal of nonsurgical wound dressing: Secondary | ICD-10-CM | POA: Diagnosis not present

## 2019-08-09 DIAGNOSIS — I10 Essential (primary) hypertension: Secondary | ICD-10-CM | POA: Diagnosis not present

## 2019-08-09 DIAGNOSIS — L89620 Pressure ulcer of left heel, unstageable: Secondary | ICD-10-CM | POA: Diagnosis not present

## 2019-08-09 DIAGNOSIS — J449 Chronic obstructive pulmonary disease, unspecified: Secondary | ICD-10-CM | POA: Diagnosis not present

## 2019-08-13 DIAGNOSIS — L89620 Pressure ulcer of left heel, unstageable: Secondary | ICD-10-CM | POA: Diagnosis not present

## 2019-08-13 DIAGNOSIS — R600 Localized edema: Secondary | ICD-10-CM | POA: Diagnosis not present

## 2019-08-13 DIAGNOSIS — L989 Disorder of the skin and subcutaneous tissue, unspecified: Secondary | ICD-10-CM | POA: Diagnosis not present

## 2019-08-13 DIAGNOSIS — Z4800 Encounter for change or removal of nonsurgical wound dressing: Secondary | ICD-10-CM | POA: Diagnosis not present

## 2019-08-13 DIAGNOSIS — L89892 Pressure ulcer of other site, stage 2: Secondary | ICD-10-CM | POA: Diagnosis not present

## 2019-08-13 DIAGNOSIS — L89610 Pressure ulcer of right heel, unstageable: Secondary | ICD-10-CM | POA: Diagnosis not present

## 2019-08-13 DIAGNOSIS — M109 Gout, unspecified: Secondary | ICD-10-CM | POA: Diagnosis not present

## 2019-08-13 DIAGNOSIS — I10 Essential (primary) hypertension: Secondary | ICD-10-CM | POA: Diagnosis not present

## 2019-08-13 DIAGNOSIS — J449 Chronic obstructive pulmonary disease, unspecified: Secondary | ICD-10-CM | POA: Diagnosis not present

## 2019-08-16 DIAGNOSIS — L89610 Pressure ulcer of right heel, unstageable: Secondary | ICD-10-CM | POA: Diagnosis not present

## 2019-08-16 DIAGNOSIS — L89892 Pressure ulcer of other site, stage 2: Secondary | ICD-10-CM | POA: Diagnosis not present

## 2019-08-16 DIAGNOSIS — M109 Gout, unspecified: Secondary | ICD-10-CM | POA: Diagnosis not present

## 2019-08-16 DIAGNOSIS — J449 Chronic obstructive pulmonary disease, unspecified: Secondary | ICD-10-CM | POA: Diagnosis not present

## 2019-08-16 DIAGNOSIS — L989 Disorder of the skin and subcutaneous tissue, unspecified: Secondary | ICD-10-CM | POA: Diagnosis not present

## 2019-08-16 DIAGNOSIS — L89620 Pressure ulcer of left heel, unstageable: Secondary | ICD-10-CM | POA: Diagnosis not present

## 2019-08-16 DIAGNOSIS — R600 Localized edema: Secondary | ICD-10-CM | POA: Diagnosis not present

## 2019-08-16 DIAGNOSIS — Z4800 Encounter for change or removal of nonsurgical wound dressing: Secondary | ICD-10-CM | POA: Diagnosis not present

## 2019-08-16 DIAGNOSIS — I10 Essential (primary) hypertension: Secondary | ICD-10-CM | POA: Diagnosis not present

## 2019-08-20 DIAGNOSIS — J449 Chronic obstructive pulmonary disease, unspecified: Secondary | ICD-10-CM | POA: Diagnosis not present

## 2019-08-20 DIAGNOSIS — L89610 Pressure ulcer of right heel, unstageable: Secondary | ICD-10-CM | POA: Diagnosis not present

## 2019-08-20 DIAGNOSIS — R600 Localized edema: Secondary | ICD-10-CM | POA: Diagnosis not present

## 2019-08-20 DIAGNOSIS — I10 Essential (primary) hypertension: Secondary | ICD-10-CM | POA: Diagnosis not present

## 2019-08-20 DIAGNOSIS — L989 Disorder of the skin and subcutaneous tissue, unspecified: Secondary | ICD-10-CM | POA: Diagnosis not present

## 2019-08-20 DIAGNOSIS — M109 Gout, unspecified: Secondary | ICD-10-CM | POA: Diagnosis not present

## 2019-08-20 DIAGNOSIS — Z4800 Encounter for change or removal of nonsurgical wound dressing: Secondary | ICD-10-CM | POA: Diagnosis not present

## 2019-08-20 DIAGNOSIS — L89620 Pressure ulcer of left heel, unstageable: Secondary | ICD-10-CM | POA: Diagnosis not present

## 2019-08-20 DIAGNOSIS — L89892 Pressure ulcer of other site, stage 2: Secondary | ICD-10-CM | POA: Diagnosis not present

## 2019-08-21 DIAGNOSIS — Z4800 Encounter for change or removal of nonsurgical wound dressing: Secondary | ICD-10-CM | POA: Diagnosis not present

## 2019-08-21 DIAGNOSIS — L89620 Pressure ulcer of left heel, unstageable: Secondary | ICD-10-CM | POA: Diagnosis not present

## 2019-08-21 DIAGNOSIS — R600 Localized edema: Secondary | ICD-10-CM | POA: Diagnosis not present

## 2019-08-21 DIAGNOSIS — L89610 Pressure ulcer of right heel, unstageable: Secondary | ICD-10-CM | POA: Diagnosis not present

## 2019-08-21 DIAGNOSIS — L89892 Pressure ulcer of other site, stage 2: Secondary | ICD-10-CM | POA: Diagnosis not present

## 2019-08-21 DIAGNOSIS — L989 Disorder of the skin and subcutaneous tissue, unspecified: Secondary | ICD-10-CM | POA: Diagnosis not present

## 2019-08-21 DIAGNOSIS — J449 Chronic obstructive pulmonary disease, unspecified: Secondary | ICD-10-CM | POA: Diagnosis not present

## 2019-08-21 DIAGNOSIS — I10 Essential (primary) hypertension: Secondary | ICD-10-CM | POA: Diagnosis not present

## 2019-08-21 DIAGNOSIS — M109 Gout, unspecified: Secondary | ICD-10-CM | POA: Diagnosis not present

## 2019-08-23 DIAGNOSIS — Z4800 Encounter for change or removal of nonsurgical wound dressing: Secondary | ICD-10-CM | POA: Diagnosis not present

## 2019-08-23 DIAGNOSIS — L89610 Pressure ulcer of right heel, unstageable: Secondary | ICD-10-CM | POA: Diagnosis not present

## 2019-08-23 DIAGNOSIS — L89892 Pressure ulcer of other site, stage 2: Secondary | ICD-10-CM | POA: Diagnosis not present

## 2019-08-23 DIAGNOSIS — M4322 Fusion of spine, cervical region: Secondary | ICD-10-CM | POA: Diagnosis not present

## 2019-08-23 DIAGNOSIS — M6281 Muscle weakness (generalized): Secondary | ICD-10-CM | POA: Diagnosis not present

## 2019-08-23 DIAGNOSIS — L989 Disorder of the skin and subcutaneous tissue, unspecified: Secondary | ICD-10-CM | POA: Diagnosis not present

## 2019-08-23 DIAGNOSIS — R600 Localized edema: Secondary | ICD-10-CM | POA: Diagnosis not present

## 2019-08-23 DIAGNOSIS — J449 Chronic obstructive pulmonary disease, unspecified: Secondary | ICD-10-CM | POA: Diagnosis not present

## 2019-08-23 DIAGNOSIS — L03115 Cellulitis of right lower limb: Secondary | ICD-10-CM | POA: Diagnosis not present

## 2019-08-23 DIAGNOSIS — I10 Essential (primary) hypertension: Secondary | ICD-10-CM | POA: Diagnosis not present

## 2019-08-23 DIAGNOSIS — L03116 Cellulitis of left lower limb: Secondary | ICD-10-CM | POA: Diagnosis not present

## 2019-08-23 DIAGNOSIS — L89620 Pressure ulcer of left heel, unstageable: Secondary | ICD-10-CM | POA: Diagnosis not present

## 2019-08-23 DIAGNOSIS — M109 Gout, unspecified: Secondary | ICD-10-CM | POA: Diagnosis not present

## 2019-08-24 DIAGNOSIS — M6249 Contracture of muscle, multiple sites: Secondary | ICD-10-CM | POA: Diagnosis not present

## 2019-08-27 DIAGNOSIS — R600 Localized edema: Secondary | ICD-10-CM | POA: Diagnosis not present

## 2019-08-27 DIAGNOSIS — L89610 Pressure ulcer of right heel, unstageable: Secondary | ICD-10-CM | POA: Diagnosis not present

## 2019-08-27 DIAGNOSIS — Z4800 Encounter for change or removal of nonsurgical wound dressing: Secondary | ICD-10-CM | POA: Diagnosis not present

## 2019-08-27 DIAGNOSIS — I10 Essential (primary) hypertension: Secondary | ICD-10-CM | POA: Diagnosis not present

## 2019-08-27 DIAGNOSIS — L89892 Pressure ulcer of other site, stage 2: Secondary | ICD-10-CM | POA: Diagnosis not present

## 2019-08-27 DIAGNOSIS — M109 Gout, unspecified: Secondary | ICD-10-CM | POA: Diagnosis not present

## 2019-08-27 DIAGNOSIS — L989 Disorder of the skin and subcutaneous tissue, unspecified: Secondary | ICD-10-CM | POA: Diagnosis not present

## 2019-08-27 DIAGNOSIS — J449 Chronic obstructive pulmonary disease, unspecified: Secondary | ICD-10-CM | POA: Diagnosis not present

## 2019-08-27 DIAGNOSIS — L89620 Pressure ulcer of left heel, unstageable: Secondary | ICD-10-CM | POA: Diagnosis not present

## 2019-08-28 DIAGNOSIS — L89892 Pressure ulcer of other site, stage 2: Secondary | ICD-10-CM | POA: Diagnosis not present

## 2019-08-28 DIAGNOSIS — M6281 Muscle weakness (generalized): Secondary | ICD-10-CM | POA: Diagnosis not present

## 2019-08-28 DIAGNOSIS — M4322 Fusion of spine, cervical region: Secondary | ICD-10-CM | POA: Diagnosis not present

## 2019-08-28 DIAGNOSIS — L89620 Pressure ulcer of left heel, unstageable: Secondary | ICD-10-CM | POA: Diagnosis not present

## 2019-08-28 DIAGNOSIS — J449 Chronic obstructive pulmonary disease, unspecified: Secondary | ICD-10-CM | POA: Diagnosis not present

## 2019-08-28 DIAGNOSIS — L89610 Pressure ulcer of right heel, unstageable: Secondary | ICD-10-CM | POA: Diagnosis not present

## 2019-08-30 DIAGNOSIS — L98429 Non-pressure chronic ulcer of back with unspecified severity: Secondary | ICD-10-CM | POA: Diagnosis not present

## 2019-08-30 DIAGNOSIS — N12 Tubulo-interstitial nephritis, not specified as acute or chronic: Secondary | ICD-10-CM | POA: Diagnosis not present

## 2019-08-30 DIAGNOSIS — A419 Sepsis, unspecified organism: Secondary | ICD-10-CM | POA: Diagnosis not present

## 2019-08-30 DIAGNOSIS — Z23 Encounter for immunization: Secondary | ICD-10-CM | POA: Diagnosis not present

## 2019-08-30 DIAGNOSIS — R600 Localized edema: Secondary | ICD-10-CM | POA: Diagnosis not present

## 2019-08-30 DIAGNOSIS — A4189 Other specified sepsis: Secondary | ICD-10-CM | POA: Diagnosis not present

## 2019-08-30 DIAGNOSIS — Z7401 Bed confinement status: Secondary | ICD-10-CM | POA: Diagnosis not present

## 2019-08-30 DIAGNOSIS — E871 Hypo-osmolality and hyponatremia: Secondary | ICD-10-CM | POA: Diagnosis not present

## 2019-08-30 DIAGNOSIS — R0902 Hypoxemia: Secondary | ICD-10-CM | POA: Diagnosis not present

## 2019-08-30 DIAGNOSIS — L89892 Pressure ulcer of other site, stage 2: Secondary | ICD-10-CM | POA: Diagnosis not present

## 2019-08-30 DIAGNOSIS — R29898 Other symptoms and signs involving the musculoskeletal system: Secondary | ICD-10-CM | POA: Diagnosis not present

## 2019-08-30 DIAGNOSIS — I499 Cardiac arrhythmia, unspecified: Secondary | ICD-10-CM | POA: Diagnosis not present

## 2019-08-30 DIAGNOSIS — S92354A Nondisplaced fracture of fifth metatarsal bone, right foot, initial encounter for closed fracture: Secondary | ICD-10-CM | POA: Diagnosis not present

## 2019-08-30 DIAGNOSIS — N179 Acute kidney failure, unspecified: Secondary | ICD-10-CM | POA: Diagnosis not present

## 2019-08-30 DIAGNOSIS — M109 Gout, unspecified: Secondary | ICD-10-CM | POA: Diagnosis not present

## 2019-08-30 DIAGNOSIS — I482 Chronic atrial fibrillation, unspecified: Secondary | ICD-10-CM | POA: Diagnosis not present

## 2019-08-30 DIAGNOSIS — E875 Hyperkalemia: Secondary | ICD-10-CM | POA: Diagnosis not present

## 2019-08-30 DIAGNOSIS — I10 Essential (primary) hypertension: Secondary | ICD-10-CM | POA: Diagnosis not present

## 2019-08-30 DIAGNOSIS — N1330 Unspecified hydronephrosis: Secondary | ICD-10-CM | POA: Diagnosis not present

## 2019-08-30 DIAGNOSIS — Q2546 Tortuous aortic arch: Secondary | ICD-10-CM | POA: Diagnosis not present

## 2019-08-30 DIAGNOSIS — L97529 Non-pressure chronic ulcer of other part of left foot with unspecified severity: Secondary | ICD-10-CM | POA: Diagnosis not present

## 2019-08-30 DIAGNOSIS — L89620 Pressure ulcer of left heel, unstageable: Secondary | ICD-10-CM | POA: Diagnosis not present

## 2019-08-30 DIAGNOSIS — J929 Pleural plaque without asbestos: Secondary | ICD-10-CM | POA: Diagnosis not present

## 2019-08-30 DIAGNOSIS — R4182 Altered mental status, unspecified: Secondary | ICD-10-CM | POA: Diagnosis not present

## 2019-08-30 DIAGNOSIS — I4891 Unspecified atrial fibrillation: Secondary | ICD-10-CM | POA: Diagnosis not present

## 2019-08-30 DIAGNOSIS — R69 Illness, unspecified: Secondary | ICD-10-CM | POA: Diagnosis not present

## 2019-08-30 DIAGNOSIS — R6521 Severe sepsis with septic shock: Secondary | ICD-10-CM | POA: Diagnosis not present

## 2019-08-30 DIAGNOSIS — E46 Unspecified protein-calorie malnutrition: Secondary | ICD-10-CM | POA: Diagnosis not present

## 2019-08-30 DIAGNOSIS — L989 Disorder of the skin and subcutaneous tissue, unspecified: Secondary | ICD-10-CM | POA: Diagnosis not present

## 2019-08-30 DIAGNOSIS — N390 Urinary tract infection, site not specified: Secondary | ICD-10-CM | POA: Diagnosis not present

## 2019-08-30 DIAGNOSIS — R404 Transient alteration of awareness: Secondary | ICD-10-CM | POA: Diagnosis not present

## 2019-08-30 DIAGNOSIS — J449 Chronic obstructive pulmonary disease, unspecified: Secondary | ICD-10-CM | POA: Diagnosis not present

## 2019-08-30 DIAGNOSIS — I517 Cardiomegaly: Secondary | ICD-10-CM | POA: Diagnosis not present

## 2019-08-30 DIAGNOSIS — L97413 Non-pressure chronic ulcer of right heel and midfoot with necrosis of muscle: Secondary | ICD-10-CM | POA: Diagnosis not present

## 2019-08-30 DIAGNOSIS — L89610 Pressure ulcer of right heel, unstageable: Secondary | ICD-10-CM | POA: Diagnosis not present

## 2019-08-30 DIAGNOSIS — Z4800 Encounter for change or removal of nonsurgical wound dressing: Secondary | ICD-10-CM | POA: Diagnosis not present

## 2019-08-30 DIAGNOSIS — I5042 Chronic combined systolic (congestive) and diastolic (congestive) heart failure: Secondary | ICD-10-CM | POA: Diagnosis not present

## 2019-08-30 DIAGNOSIS — R4020 Unspecified coma: Secondary | ICD-10-CM | POA: Diagnosis not present

## 2019-08-30 DIAGNOSIS — T83511A Infection and inflammatory reaction due to indwelling urethral catheter, initial encounter: Secondary | ICD-10-CM | POA: Diagnosis not present

## 2019-08-31 DIAGNOSIS — Q2546 Tortuous aortic arch: Secondary | ICD-10-CM | POA: Diagnosis not present

## 2019-08-31 DIAGNOSIS — J929 Pleural plaque without asbestos: Secondary | ICD-10-CM | POA: Diagnosis not present

## 2019-08-31 DIAGNOSIS — I517 Cardiomegaly: Secondary | ICD-10-CM | POA: Diagnosis not present

## 2019-09-07 DIAGNOSIS — Z23 Encounter for immunization: Secondary | ICD-10-CM | POA: Diagnosis not present

## 2019-09-17 DIAGNOSIS — L89159 Pressure ulcer of sacral region, unspecified stage: Secondary | ICD-10-CM | POA: Diagnosis not present

## 2019-09-17 DIAGNOSIS — M62262 Nontraumatic ischemic infarction of muscle, left lower leg: Secondary | ICD-10-CM | POA: Diagnosis not present

## 2019-09-17 DIAGNOSIS — A4102 Sepsis due to Methicillin resistant Staphylococcus aureus: Secondary | ICD-10-CM | POA: Diagnosis not present

## 2019-09-17 DIAGNOSIS — L97419 Non-pressure chronic ulcer of right heel and midfoot with unspecified severity: Secondary | ICD-10-CM | POA: Diagnosis not present

## 2019-09-17 DIAGNOSIS — N179 Acute kidney failure, unspecified: Secondary | ICD-10-CM | POA: Diagnosis not present

## 2019-09-17 DIAGNOSIS — Z4682 Encounter for fitting and adjustment of non-vascular catheter: Secondary | ICD-10-CM | POA: Diagnosis not present

## 2019-09-17 DIAGNOSIS — M7989 Other specified soft tissue disorders: Secondary | ICD-10-CM | POA: Diagnosis not present

## 2019-09-17 DIAGNOSIS — E869 Volume depletion, unspecified: Secondary | ICD-10-CM | POA: Diagnosis not present

## 2019-09-17 DIAGNOSIS — D65 Disseminated intravascular coagulation [defibrination syndrome]: Secondary | ICD-10-CM | POA: Diagnosis not present

## 2019-09-17 DIAGNOSIS — S7012XA Contusion of left thigh, initial encounter: Secondary | ICD-10-CM | POA: Diagnosis not present

## 2019-09-17 DIAGNOSIS — R410 Disorientation, unspecified: Secondary | ICD-10-CM | POA: Diagnosis not present

## 2019-09-17 DIAGNOSIS — L89324 Pressure ulcer of left buttock, stage 4: Secondary | ICD-10-CM | POA: Diagnosis not present

## 2019-09-17 DIAGNOSIS — L89319 Pressure ulcer of right buttock, unspecified stage: Secondary | ICD-10-CM | POA: Diagnosis not present

## 2019-09-17 DIAGNOSIS — R52 Pain, unspecified: Secondary | ICD-10-CM | POA: Diagnosis not present

## 2019-09-17 DIAGNOSIS — J189 Pneumonia, unspecified organism: Secondary | ICD-10-CM | POA: Diagnosis not present

## 2019-09-17 DIAGNOSIS — L03116 Cellulitis of left lower limb: Secondary | ICD-10-CM | POA: Diagnosis not present

## 2019-09-17 DIAGNOSIS — J9601 Acute respiratory failure with hypoxia: Secondary | ICD-10-CM | POA: Diagnosis not present

## 2019-09-17 DIAGNOSIS — R402411 Glasgow coma scale score 13-15, in the field [EMT or ambulance]: Secondary | ICD-10-CM | POA: Diagnosis not present

## 2019-09-17 DIAGNOSIS — M879 Osteonecrosis, unspecified: Secondary | ICD-10-CM | POA: Diagnosis not present

## 2019-09-17 DIAGNOSIS — L03115 Cellulitis of right lower limb: Secondary | ICD-10-CM | POA: Diagnosis not present

## 2019-09-17 DIAGNOSIS — I70261 Atherosclerosis of native arteries of extremities with gangrene, right leg: Secondary | ICD-10-CM | POA: Diagnosis not present

## 2019-09-17 DIAGNOSIS — Z9911 Dependence on respirator [ventilator] status: Secondary | ICD-10-CM | POA: Diagnosis not present

## 2019-09-17 DIAGNOSIS — L97519 Non-pressure chronic ulcer of other part of right foot with unspecified severity: Secondary | ICD-10-CM | POA: Diagnosis not present

## 2019-09-17 DIAGNOSIS — J449 Chronic obstructive pulmonary disease, unspecified: Secondary | ICD-10-CM | POA: Diagnosis not present

## 2019-09-17 DIAGNOSIS — J440 Chronic obstructive pulmonary disease with acute lower respiratory infection: Secondary | ICD-10-CM | POA: Diagnosis not present

## 2019-09-17 DIAGNOSIS — M6281 Muscle weakness (generalized): Secondary | ICD-10-CM | POA: Diagnosis not present

## 2019-09-17 DIAGNOSIS — J90 Pleural effusion, not elsewhere classified: Secondary | ICD-10-CM | POA: Diagnosis not present

## 2019-09-17 DIAGNOSIS — M79605 Pain in left leg: Secondary | ICD-10-CM | POA: Diagnosis not present

## 2019-09-17 DIAGNOSIS — L89154 Pressure ulcer of sacral region, stage 4: Secondary | ICD-10-CM | POA: Diagnosis not present

## 2019-09-17 DIAGNOSIS — R404 Transient alteration of awareness: Secondary | ICD-10-CM | POA: Diagnosis not present

## 2019-09-17 DIAGNOSIS — M6249 Contracture of muscle, multiple sites: Secondary | ICD-10-CM | POA: Diagnosis not present

## 2019-09-17 DIAGNOSIS — R442 Other hallucinations: Secondary | ICD-10-CM | POA: Diagnosis not present

## 2019-09-17 DIAGNOSIS — S80841A External constriction, right lower leg, initial encounter: Secondary | ICD-10-CM | POA: Diagnosis not present

## 2019-09-17 DIAGNOSIS — T796XXA Traumatic ischemia of muscle, initial encounter: Secondary | ICD-10-CM | POA: Diagnosis not present

## 2019-09-17 DIAGNOSIS — L89300 Pressure ulcer of unspecified buttock, unstageable: Secondary | ICD-10-CM | POA: Diagnosis not present

## 2019-09-17 DIAGNOSIS — N390 Urinary tract infection, site not specified: Secondary | ICD-10-CM | POA: Diagnosis not present

## 2019-09-17 DIAGNOSIS — I70262 Atherosclerosis of native arteries of extremities with gangrene, left leg: Secondary | ICD-10-CM | POA: Diagnosis not present

## 2019-09-17 DIAGNOSIS — M62261 Nontraumatic ischemic infarction of muscle, right lower leg: Secondary | ICD-10-CM | POA: Diagnosis not present

## 2019-09-17 DIAGNOSIS — I482 Chronic atrial fibrillation, unspecified: Secondary | ICD-10-CM | POA: Diagnosis not present

## 2019-09-17 DIAGNOSIS — M4322 Fusion of spine, cervical region: Secondary | ICD-10-CM | POA: Diagnosis not present

## 2019-09-19 DIAGNOSIS — L89159 Pressure ulcer of sacral region, unspecified stage: Secondary | ICD-10-CM | POA: Diagnosis not present

## 2019-09-19 DIAGNOSIS — M879 Osteonecrosis, unspecified: Secondary | ICD-10-CM | POA: Diagnosis not present

## 2019-09-19 DIAGNOSIS — Z4682 Encounter for fitting and adjustment of non-vascular catheter: Secondary | ICD-10-CM | POA: Diagnosis not present

## 2019-09-19 DIAGNOSIS — M62262 Nontraumatic ischemic infarction of muscle, left lower leg: Secondary | ICD-10-CM | POA: Diagnosis not present

## 2019-09-19 DIAGNOSIS — L89319 Pressure ulcer of right buttock, unspecified stage: Secondary | ICD-10-CM | POA: Diagnosis not present

## 2019-09-19 DIAGNOSIS — M62261 Nontraumatic ischemic infarction of muscle, right lower leg: Secondary | ICD-10-CM | POA: Diagnosis not present

## 2019-09-19 DIAGNOSIS — I70261 Atherosclerosis of native arteries of extremities with gangrene, right leg: Secondary | ICD-10-CM | POA: Diagnosis not present

## 2019-09-19 DIAGNOSIS — J90 Pleural effusion, not elsewhere classified: Secondary | ICD-10-CM | POA: Diagnosis not present

## 2019-09-20 DIAGNOSIS — Z9911 Dependence on respirator [ventilator] status: Secondary | ICD-10-CM | POA: Diagnosis not present

## 2019-09-20 DIAGNOSIS — J90 Pleural effusion, not elsewhere classified: Secondary | ICD-10-CM | POA: Diagnosis not present

## 2019-09-21 DIAGNOSIS — J90 Pleural effusion, not elsewhere classified: Secondary | ICD-10-CM | POA: Diagnosis not present

## 2019-09-22 DIAGNOSIS — L03115 Cellulitis of right lower limb: Secondary | ICD-10-CM | POA: Diagnosis not present

## 2019-09-22 DIAGNOSIS — J449 Chronic obstructive pulmonary disease, unspecified: Secondary | ICD-10-CM | POA: Diagnosis not present

## 2019-09-22 DIAGNOSIS — L03116 Cellulitis of left lower limb: Secondary | ICD-10-CM | POA: Diagnosis not present

## 2019-09-22 DIAGNOSIS — M6281 Muscle weakness (generalized): Secondary | ICD-10-CM | POA: Diagnosis not present

## 2019-09-22 DIAGNOSIS — M4322 Fusion of spine, cervical region: Secondary | ICD-10-CM | POA: Diagnosis not present

## 2019-09-23 DIAGNOSIS — M6249 Contracture of muscle, multiple sites: Secondary | ICD-10-CM | POA: Diagnosis not present

## 2019-09-24 DIAGNOSIS — J449 Chronic obstructive pulmonary disease, unspecified: Secondary | ICD-10-CM | POA: Diagnosis not present

## 2019-09-24 DIAGNOSIS — J90 Pleural effusion, not elsewhere classified: Secondary | ICD-10-CM | POA: Diagnosis not present

## 2019-09-30 DIAGNOSIS — A4102 Sepsis due to Methicillin resistant Staphylococcus aureus: Secondary | ICD-10-CM | POA: Diagnosis not present

## 2019-09-30 DIAGNOSIS — D65 Disseminated intravascular coagulation [defibrination syndrome]: Secondary | ICD-10-CM | POA: Diagnosis not present

## 2019-09-30 DIAGNOSIS — L89154 Pressure ulcer of sacral region, stage 4: Secondary | ICD-10-CM | POA: Diagnosis not present

## 2019-09-30 DIAGNOSIS — J9601 Acute respiratory failure with hypoxia: Secondary | ICD-10-CM | POA: Diagnosis not present

## 2019-09-30 DIAGNOSIS — J189 Pneumonia, unspecified organism: Secondary | ICD-10-CM | POA: Diagnosis not present

## 2019-10-30 DEATH — deceased
